# Patient Record
Sex: Male | Born: 1955 | Race: White | Hispanic: No | Marital: Married | Smoking: Former smoker
Health system: Southern US, Community
[De-identification: ages and names within clinical notes are randomized; demographics above are authoritative.]

## PROBLEM LIST (undated history)

## (undated) ENCOUNTER — Emergency Department (HOSPITAL_COMMUNITY): Payer: Self-pay | Source: Home / Self Care

## (undated) DIAGNOSIS — E119 Type 2 diabetes mellitus without complications: Secondary | ICD-10-CM

## (undated) DIAGNOSIS — G709 Myoneural disorder, unspecified: Secondary | ICD-10-CM

## (undated) DIAGNOSIS — M1990 Unspecified osteoarthritis, unspecified site: Secondary | ICD-10-CM

## (undated) HISTORY — PX: OTHER SURGICAL HISTORY: SHX169

## (undated) HISTORY — PX: TONSILLECTOMY: SUR1361

## (undated) HISTORY — DX: Type 2 diabetes mellitus without complications: E11.9

## (undated) HISTORY — DX: Myoneural disorder, unspecified: G70.9

## (undated) HISTORY — DX: Unspecified osteoarthritis, unspecified site: M19.90

---

## 2017-01-04 DIAGNOSIS — E1140 Type 2 diabetes mellitus with diabetic neuropathy, unspecified: Secondary | ICD-10-CM | POA: Diagnosis not present

## 2017-01-04 DIAGNOSIS — M79601 Pain in right arm: Secondary | ICD-10-CM | POA: Diagnosis not present

## 2017-01-04 DIAGNOSIS — M5010 Cervical disc disorder with radiculopathy, unspecified cervical region: Secondary | ICD-10-CM | POA: Diagnosis not present

## 2017-01-04 DIAGNOSIS — M545 Low back pain: Secondary | ICD-10-CM | POA: Diagnosis not present

## 2017-01-04 DIAGNOSIS — Z79891 Long term (current) use of opiate analgesic: Secondary | ICD-10-CM | POA: Diagnosis not present

## 2017-01-04 DIAGNOSIS — E785 Hyperlipidemia, unspecified: Secondary | ICD-10-CM | POA: Diagnosis not present

## 2017-01-04 DIAGNOSIS — I10 Essential (primary) hypertension: Secondary | ICD-10-CM | POA: Diagnosis not present

## 2017-01-04 DIAGNOSIS — M792 Neuralgia and neuritis, unspecified: Secondary | ICD-10-CM | POA: Diagnosis not present

## 2017-01-04 DIAGNOSIS — G5600 Carpal tunnel syndrome, unspecified upper limb: Secondary | ICD-10-CM | POA: Diagnosis not present

## 2017-01-19 DIAGNOSIS — M545 Low back pain: Secondary | ICD-10-CM | POA: Diagnosis not present

## 2017-01-19 DIAGNOSIS — I10 Essential (primary) hypertension: Secondary | ICD-10-CM | POA: Diagnosis not present

## 2017-01-19 DIAGNOSIS — E118 Type 2 diabetes mellitus with unspecified complications: Secondary | ICD-10-CM | POA: Diagnosis not present

## 2017-01-19 DIAGNOSIS — E7800 Pure hypercholesterolemia, unspecified: Secondary | ICD-10-CM | POA: Diagnosis not present

## 2017-01-22 ENCOUNTER — Other Ambulatory Visit: Payer: Self-pay | Admitting: Neurology

## 2017-01-22 DIAGNOSIS — N882 Stricture and stenosis of cervix uteri: Secondary | ICD-10-CM

## 2017-01-22 DIAGNOSIS — M5410 Radiculopathy, site unspecified: Secondary | ICD-10-CM

## 2017-01-25 ENCOUNTER — Other Ambulatory Visit: Payer: Self-pay | Admitting: Neurology

## 2017-01-25 DIAGNOSIS — M79642 Pain in left hand: Secondary | ICD-10-CM

## 2017-02-03 DIAGNOSIS — M545 Low back pain: Secondary | ICD-10-CM | POA: Diagnosis not present

## 2017-02-03 DIAGNOSIS — G5600 Carpal tunnel syndrome, unspecified upper limb: Secondary | ICD-10-CM | POA: Diagnosis not present

## 2017-02-03 DIAGNOSIS — I10 Essential (primary) hypertension: Secondary | ICD-10-CM | POA: Diagnosis not present

## 2017-02-03 DIAGNOSIS — E1140 Type 2 diabetes mellitus with diabetic neuropathy, unspecified: Secondary | ICD-10-CM | POA: Diagnosis not present

## 2017-02-03 DIAGNOSIS — M79601 Pain in right arm: Secondary | ICD-10-CM | POA: Diagnosis not present

## 2017-02-03 DIAGNOSIS — E785 Hyperlipidemia, unspecified: Secondary | ICD-10-CM | POA: Diagnosis not present

## 2017-02-06 ENCOUNTER — Ambulatory Visit
Admission: RE | Admit: 2017-02-06 | Discharge: 2017-02-06 | Disposition: A | Discharge status: Home / Self Care | Payer: Self-pay | Source: Ambulatory Visit | Attending: Neurology | Admitting: Neurology

## 2017-02-06 ENCOUNTER — Ambulatory Visit (HOSPITAL_COMMUNITY)
Admission: RE | Admit: 2017-02-06 | Discharge: 2017-02-06 | Disposition: A | Discharge status: Home / Self Care | Payer: Medicare HMO | Source: Ambulatory Visit | Attending: Neurology | Admitting: Neurology

## 2017-02-06 DIAGNOSIS — M79642 Pain in left hand: Secondary | ICD-10-CM | POA: Insufficient documentation

## 2017-02-06 DIAGNOSIS — M50221 Other cervical disc displacement at C4-C5 level: Secondary | ICD-10-CM | POA: Diagnosis not present

## 2017-02-06 DIAGNOSIS — N882 Stricture and stenosis of cervix uteri: Secondary | ICD-10-CM

## 2017-02-06 DIAGNOSIS — M50222 Other cervical disc displacement at C5-C6 level: Secondary | ICD-10-CM | POA: Diagnosis not present

## 2017-02-06 DIAGNOSIS — M50223 Other cervical disc displacement at C6-C7 level: Secondary | ICD-10-CM | POA: Diagnosis not present

## 2017-02-06 DIAGNOSIS — M5410 Radiculopathy, site unspecified: Secondary | ICD-10-CM

## 2017-02-06 DIAGNOSIS — M5021 Other cervical disc displacement,  high cervical region: Secondary | ICD-10-CM | POA: Diagnosis not present

## 2017-03-05 DIAGNOSIS — M79601 Pain in right arm: Secondary | ICD-10-CM | POA: Diagnosis not present

## 2017-03-05 DIAGNOSIS — M545 Low back pain: Secondary | ICD-10-CM | POA: Diagnosis not present

## 2017-03-05 DIAGNOSIS — I10 Essential (primary) hypertension: Secondary | ICD-10-CM | POA: Diagnosis not present

## 2017-03-05 DIAGNOSIS — E1140 Type 2 diabetes mellitus with diabetic neuropathy, unspecified: Secondary | ICD-10-CM | POA: Diagnosis not present

## 2017-03-05 DIAGNOSIS — M542 Cervicalgia: Secondary | ICD-10-CM | POA: Diagnosis not present

## 2017-03-05 DIAGNOSIS — E785 Hyperlipidemia, unspecified: Secondary | ICD-10-CM | POA: Diagnosis not present

## 2017-03-05 DIAGNOSIS — G5600 Carpal tunnel syndrome, unspecified upper limb: Secondary | ICD-10-CM | POA: Diagnosis not present

## 2017-03-11 DIAGNOSIS — M542 Cervicalgia: Secondary | ICD-10-CM | POA: Diagnosis not present

## 2017-03-11 DIAGNOSIS — M79601 Pain in right arm: Secondary | ICD-10-CM | POA: Diagnosis not present

## 2017-03-11 DIAGNOSIS — I10 Essential (primary) hypertension: Secondary | ICD-10-CM | POA: Diagnosis not present

## 2017-03-11 DIAGNOSIS — M545 Low back pain: Secondary | ICD-10-CM | POA: Diagnosis not present

## 2017-03-11 DIAGNOSIS — E785 Hyperlipidemia, unspecified: Secondary | ICD-10-CM | POA: Diagnosis not present

## 2017-03-11 DIAGNOSIS — E1140 Type 2 diabetes mellitus with diabetic neuropathy, unspecified: Secondary | ICD-10-CM | POA: Diagnosis not present

## 2017-03-11 DIAGNOSIS — G5600 Carpal tunnel syndrome, unspecified upper limb: Secondary | ICD-10-CM | POA: Diagnosis not present

## 2017-03-17 DIAGNOSIS — L729 Follicular cyst of the skin and subcutaneous tissue, unspecified: Secondary | ICD-10-CM | POA: Diagnosis not present

## 2017-03-17 DIAGNOSIS — M542 Cervicalgia: Secondary | ICD-10-CM | POA: Diagnosis not present

## 2017-03-17 DIAGNOSIS — I10 Essential (primary) hypertension: Secondary | ICD-10-CM | POA: Diagnosis not present

## 2017-03-17 DIAGNOSIS — E1140 Type 2 diabetes mellitus with diabetic neuropathy, unspecified: Secondary | ICD-10-CM | POA: Diagnosis not present

## 2017-03-17 DIAGNOSIS — M79601 Pain in right arm: Secondary | ICD-10-CM | POA: Diagnosis not present

## 2017-03-17 DIAGNOSIS — M545 Low back pain: Secondary | ICD-10-CM | POA: Diagnosis not present

## 2017-03-17 DIAGNOSIS — Z79891 Long term (current) use of opiate analgesic: Secondary | ICD-10-CM | POA: Diagnosis not present

## 2017-03-17 DIAGNOSIS — E785 Hyperlipidemia, unspecified: Secondary | ICD-10-CM | POA: Diagnosis not present

## 2017-03-17 DIAGNOSIS — G5600 Carpal tunnel syndrome, unspecified upper limb: Secondary | ICD-10-CM | POA: Diagnosis not present

## 2017-03-26 DIAGNOSIS — E118 Type 2 diabetes mellitus with unspecified complications: Secondary | ICD-10-CM | POA: Diagnosis not present

## 2017-03-26 DIAGNOSIS — I10 Essential (primary) hypertension: Secondary | ICD-10-CM | POA: Diagnosis not present

## 2017-03-26 DIAGNOSIS — Z125 Encounter for screening for malignant neoplasm of prostate: Secondary | ICD-10-CM | POA: Diagnosis not present

## 2017-03-30 DIAGNOSIS — M549 Dorsalgia, unspecified: Secondary | ICD-10-CM | POA: Diagnosis not present

## 2017-03-30 DIAGNOSIS — E118 Type 2 diabetes mellitus with unspecified complications: Secondary | ICD-10-CM | POA: Diagnosis not present

## 2017-03-30 DIAGNOSIS — J309 Allergic rhinitis, unspecified: Secondary | ICD-10-CM | POA: Diagnosis not present

## 2017-03-30 DIAGNOSIS — I10 Essential (primary) hypertension: Secondary | ICD-10-CM | POA: Diagnosis not present

## 2017-03-30 DIAGNOSIS — Z23 Encounter for immunization: Secondary | ICD-10-CM | POA: Diagnosis not present

## 2017-03-30 DIAGNOSIS — E7800 Pure hypercholesterolemia, unspecified: Secondary | ICD-10-CM | POA: Diagnosis not present

## 2017-04-15 DIAGNOSIS — M79601 Pain in right arm: Secondary | ICD-10-CM | POA: Diagnosis not present

## 2017-04-15 DIAGNOSIS — G5600 Carpal tunnel syndrome, unspecified upper limb: Secondary | ICD-10-CM | POA: Diagnosis not present

## 2017-04-15 DIAGNOSIS — M542 Cervicalgia: Secondary | ICD-10-CM | POA: Diagnosis not present

## 2017-04-15 DIAGNOSIS — M545 Low back pain: Secondary | ICD-10-CM | POA: Diagnosis not present

## 2017-04-20 DIAGNOSIS — M542 Cervicalgia: Secondary | ICD-10-CM | POA: Diagnosis not present

## 2017-04-20 DIAGNOSIS — M4722 Other spondylosis with radiculopathy, cervical region: Secondary | ICD-10-CM | POA: Diagnosis not present

## 2017-04-20 DIAGNOSIS — M545 Low back pain: Secondary | ICD-10-CM | POA: Diagnosis not present

## 2017-04-20 DIAGNOSIS — Z6829 Body mass index (BMI) 29.0-29.9, adult: Secondary | ICD-10-CM | POA: Diagnosis not present

## 2017-04-20 DIAGNOSIS — M4802 Spinal stenosis, cervical region: Secondary | ICD-10-CM | POA: Diagnosis not present

## 2017-04-20 DIAGNOSIS — I10 Essential (primary) hypertension: Secondary | ICD-10-CM | POA: Diagnosis not present

## 2017-04-27 ENCOUNTER — Other Ambulatory Visit: Payer: Self-pay | Admitting: Neurosurgery

## 2017-04-27 DIAGNOSIS — G8929 Other chronic pain: Secondary | ICD-10-CM

## 2017-04-27 DIAGNOSIS — M545 Low back pain: Principal | ICD-10-CM

## 2017-05-05 DIAGNOSIS — M542 Cervicalgia: Secondary | ICD-10-CM | POA: Diagnosis not present

## 2017-05-05 DIAGNOSIS — G5600 Carpal tunnel syndrome, unspecified upper limb: Secondary | ICD-10-CM | POA: Diagnosis not present

## 2017-05-05 DIAGNOSIS — M545 Low back pain: Secondary | ICD-10-CM | POA: Diagnosis not present

## 2017-05-05 DIAGNOSIS — E1140 Type 2 diabetes mellitus with diabetic neuropathy, unspecified: Secondary | ICD-10-CM | POA: Diagnosis not present

## 2017-05-08 ENCOUNTER — Ambulatory Visit
Admission: RE | Admit: 2017-05-08 | Discharge: 2017-05-08 | Disposition: A | Discharge status: Home / Self Care | Payer: Medicare HMO | Source: Ambulatory Visit | Attending: Neurosurgery | Admitting: Neurosurgery

## 2017-05-08 DIAGNOSIS — M5127 Other intervertebral disc displacement, lumbosacral region: Secondary | ICD-10-CM | POA: Diagnosis not present

## 2017-05-08 DIAGNOSIS — G8929 Other chronic pain: Secondary | ICD-10-CM

## 2017-05-08 DIAGNOSIS — M545 Low back pain: Principal | ICD-10-CM

## 2017-06-02 DIAGNOSIS — G8921 Chronic pain due to trauma: Secondary | ICD-10-CM | POA: Diagnosis not present

## 2017-06-02 DIAGNOSIS — M545 Low back pain: Secondary | ICD-10-CM | POA: Diagnosis not present

## 2017-06-02 DIAGNOSIS — M461 Sacroiliitis, not elsewhere classified: Secondary | ICD-10-CM | POA: Diagnosis not present

## 2017-06-02 DIAGNOSIS — Z79891 Long term (current) use of opiate analgesic: Secondary | ICD-10-CM | POA: Diagnosis not present

## 2017-06-17 DIAGNOSIS — E118 Type 2 diabetes mellitus with unspecified complications: Secondary | ICD-10-CM | POA: Diagnosis not present

## 2017-06-17 DIAGNOSIS — I10 Essential (primary) hypertension: Secondary | ICD-10-CM | POA: Diagnosis not present

## 2017-06-17 DIAGNOSIS — E7800 Pure hypercholesterolemia, unspecified: Secondary | ICD-10-CM | POA: Diagnosis not present

## 2017-06-22 DIAGNOSIS — Z79899 Other long term (current) drug therapy: Secondary | ICD-10-CM | POA: Diagnosis not present

## 2017-06-22 DIAGNOSIS — M542 Cervicalgia: Secondary | ICD-10-CM | POA: Diagnosis not present

## 2017-06-22 DIAGNOSIS — E7800 Pure hypercholesterolemia, unspecified: Secondary | ICD-10-CM | POA: Diagnosis not present

## 2017-06-22 DIAGNOSIS — E118 Type 2 diabetes mellitus with unspecified complications: Secondary | ICD-10-CM | POA: Diagnosis not present

## 2017-06-22 DIAGNOSIS — M545 Low back pain: Secondary | ICD-10-CM | POA: Diagnosis not present

## 2017-06-22 DIAGNOSIS — I10 Essential (primary) hypertension: Secondary | ICD-10-CM | POA: Diagnosis not present

## 2017-06-22 DIAGNOSIS — Z7984 Long term (current) use of oral hypoglycemic drugs: Secondary | ICD-10-CM | POA: Diagnosis not present

## 2017-07-01 DIAGNOSIS — M545 Low back pain: Secondary | ICD-10-CM | POA: Diagnosis not present

## 2017-07-01 DIAGNOSIS — M5412 Radiculopathy, cervical region: Secondary | ICD-10-CM | POA: Diagnosis not present

## 2017-07-01 DIAGNOSIS — G8921 Chronic pain due to trauma: Secondary | ICD-10-CM | POA: Diagnosis not present

## 2017-07-01 DIAGNOSIS — M461 Sacroiliitis, not elsewhere classified: Secondary | ICD-10-CM | POA: Diagnosis not present

## 2017-07-27 DIAGNOSIS — M5412 Radiculopathy, cervical region: Secondary | ICD-10-CM | POA: Diagnosis not present

## 2017-07-27 DIAGNOSIS — M461 Sacroiliitis, not elsewhere classified: Secondary | ICD-10-CM | POA: Diagnosis not present

## 2017-07-27 DIAGNOSIS — G8921 Chronic pain due to trauma: Secondary | ICD-10-CM | POA: Diagnosis not present

## 2017-07-27 DIAGNOSIS — G4733 Obstructive sleep apnea (adult) (pediatric): Secondary | ICD-10-CM | POA: Diagnosis not present

## 2017-07-30 ENCOUNTER — Encounter (HOSPITAL_BASED_OUTPATIENT_CLINIC_OR_DEPARTMENT_OTHER): Payer: Self-pay

## 2017-07-30 DIAGNOSIS — I10 Essential (primary) hypertension: Secondary | ICD-10-CM | POA: Diagnosis not present

## 2017-07-30 DIAGNOSIS — G4733 Obstructive sleep apnea (adult) (pediatric): Secondary | ICD-10-CM

## 2017-07-30 DIAGNOSIS — Z6829 Body mass index (BMI) 29.0-29.9, adult: Secondary | ICD-10-CM | POA: Diagnosis not present

## 2017-07-30 DIAGNOSIS — M545 Low back pain: Secondary | ICD-10-CM | POA: Diagnosis not present

## 2017-08-06 ENCOUNTER — Ambulatory Visit: Payer: Medicare HMO | Attending: Neurology | Admitting: Neurology

## 2017-08-06 DIAGNOSIS — G4733 Obstructive sleep apnea (adult) (pediatric): Secondary | ICD-10-CM | POA: Diagnosis present

## 2017-08-15 NOTE — Procedures (Signed)
   [facility] Jenyfer Trawick A. Gerilyn Pilgrimoonquah, MD     [email or web address]             HOME SLEEP STUDY  LOCATION: [facility]   Patient Name: Erik Taylor, Erik Taylor Study Date: 08/06/2017 Gender: Male D.O.B: Nov 13, 1955 Age (years): [age] Referring Provider: Beryle BeamsKofi Nichole Keltner MD, ABSM Height (inches): 70 Interpreting Physician: Beryle BeamsKofi Aveon Colquhoun MD, ABSM Weight (lbs): 206 RPSGT: Peak, Robert BMI: 30 MRN: [id number] Neck Size: CLINICAL INFORMATION Sleep Study Type: HST     Indication for sleep study: OSA     Epworth Sleepiness Score: NA  SLEEP STUDY TECHNIQUE A multi-channel overnight portable sleep study was performed. The channels recorded were: nasal airflow, thoracic respiratory movement, and oxygen saturation with a pulse oximetry. Snoring was also monitored.  MEDICATIONS Patient self administered medications include: N/A. No current outpatient medications on file.   SLEEP ARCHITECTURE Patient was studied for 465.5 minutes. The sleep efficiency was 77.6 % and the patient was supine for 37%. The arousal index was 0.0 per hour.  RESPIRATORY PARAMETERS The overall AHI was 18.4 per hour, with a central apnea index of 1.2 per hour.  The oxygen nadir was 84% during sleep.     CARDIAC DATA Mean heart rate during sleep was 75.4 bpm.  IMPRESSIONS Moderate obstructive sleep apnea occurred during this study (AHI = 18.4/h). A formal CPAP study is recommended.   Argie RammingKofi A Shazia Mitchener, MD Diplomate, American Board of Sleep Medicine.  ELECTRONICALLY SIGNED ON:  08/15/2017, 10:02 PM  [facility] PH: [phone]   FX: [phone] ACCREDITED BY THE AMERICAN ACADEMY OF SLEEP MEDICINE

## 2017-08-25 DIAGNOSIS — M5412 Radiculopathy, cervical region: Secondary | ICD-10-CM | POA: Diagnosis not present

## 2017-08-25 DIAGNOSIS — G4733 Obstructive sleep apnea (adult) (pediatric): Secondary | ICD-10-CM | POA: Diagnosis not present

## 2017-08-25 DIAGNOSIS — M461 Sacroiliitis, not elsewhere classified: Secondary | ICD-10-CM | POA: Diagnosis not present

## 2017-08-25 DIAGNOSIS — G8921 Chronic pain due to trauma: Secondary | ICD-10-CM | POA: Diagnosis not present

## 2017-09-08 DIAGNOSIS — E118 Type 2 diabetes mellitus with unspecified complications: Secondary | ICD-10-CM | POA: Diagnosis not present

## 2017-09-08 DIAGNOSIS — I10 Essential (primary) hypertension: Secondary | ICD-10-CM | POA: Diagnosis not present

## 2017-09-14 DIAGNOSIS — Z6831 Body mass index (BMI) 31.0-31.9, adult: Secondary | ICD-10-CM | POA: Diagnosis not present

## 2017-09-14 DIAGNOSIS — Z7984 Long term (current) use of oral hypoglycemic drugs: Secondary | ICD-10-CM | POA: Diagnosis not present

## 2017-09-14 DIAGNOSIS — M545 Low back pain: Secondary | ICD-10-CM | POA: Diagnosis not present

## 2017-09-14 DIAGNOSIS — Z79899 Other long term (current) drug therapy: Secondary | ICD-10-CM | POA: Diagnosis not present

## 2017-09-14 DIAGNOSIS — E118 Type 2 diabetes mellitus with unspecified complications: Secondary | ICD-10-CM | POA: Diagnosis not present

## 2017-09-14 DIAGNOSIS — E7800 Pure hypercholesterolemia, unspecified: Secondary | ICD-10-CM | POA: Diagnosis not present

## 2017-09-14 DIAGNOSIS — I10 Essential (primary) hypertension: Secondary | ICD-10-CM | POA: Diagnosis not present

## 2017-09-14 DIAGNOSIS — E6609 Other obesity due to excess calories: Secondary | ICD-10-CM | POA: Diagnosis not present

## 2017-09-14 DIAGNOSIS — M79601 Pain in right arm: Secondary | ICD-10-CM | POA: Diagnosis not present

## 2017-09-21 DIAGNOSIS — M5412 Radiculopathy, cervical region: Secondary | ICD-10-CM | POA: Diagnosis not present

## 2017-09-21 DIAGNOSIS — G8921 Chronic pain due to trauma: Secondary | ICD-10-CM | POA: Diagnosis not present

## 2017-09-21 DIAGNOSIS — M545 Low back pain: Secondary | ICD-10-CM | POA: Diagnosis not present

## 2017-09-21 DIAGNOSIS — M461 Sacroiliitis, not elsewhere classified: Secondary | ICD-10-CM | POA: Diagnosis not present

## 2017-10-06 ENCOUNTER — Ambulatory Visit: Payer: Medicare HMO | Attending: Neurology | Admitting: Neurology

## 2017-10-06 DIAGNOSIS — G4733 Obstructive sleep apnea (adult) (pediatric): Secondary | ICD-10-CM | POA: Diagnosis not present

## 2017-10-09 NOTE — Procedures (Signed)
  [facility] Jahron Hunsinger A. Gerilyn Pilgrimoonquah, MD     [email or web address]             NOCTURNAL POLYSOMNOGRAPHY   LOCATION: [facility]  Patient Name: Erik Taylor, [name] Study Date: 10/06/2017 Gender: Male D.O.B: February 05, 1956 Age (years): [age] Referring Provider: Beryle BeamsKofi Nikiya Starn MD, ABSM Height (inches): 70 Interpreting Physician: Beryle BeamsKofi Yael Coppess MD, ABSM Weight (lbs): 206 RPSGT: Peak, Robert BMI: 30 MRN: [id number] Neck Size: 17.00 <br> <br> CLINICAL INFORMATION The patient is referred for a CPAP titration to treat sleep apnea.    Date of NPSG, Split Night or HST:  SLEEP STUDY TECHNIQUE As per the AASM Manual for the Scoring of Sleep and Associated Events v2.3 (April 2016) with a hypopnea requiring 4% desaturations.  The channels recorded and monitored were frontal, central and occipital EEG, electrooculogram (EOG), submentalis EMG (chin), nasal and oral airflow, thoracic and abdominal wall motion, anterior tibialis EMG, snore microphone, electrocardiogram, and pulse oximetry. Continuous positive airway pressure (CPAP) was initiated at the beginning of the study and titrated to treat sleep-disordered breathing.  MEDICATIONS Medications self-administered by patient taken the night of the study : N/A  TECHNICIAN COMMENTS Comments added by technician: PATIENT COMES TO SLEEP CENTER FOR CPAP TITRATION. PATIENT WAS ADHERENT TO CPAP THERAPY. NASAL PILLOW MASK WAS APPLIED WITH OPTIMAL FITTING ACHIEVED. NO ORAL BREATHING NOTED. STAGES I, II AND REM WERE OBSERVED. SUPINE AND LATERAL POSITIONS OBSERVED. MOST SLEEP TIME WAS IN LATERAL POSITION. AIRWAY OBSTRUCTIONS WERE ELIMINATED WITH CPAP PRESSURE. NO LEG MOVEMENTS NOTED. ECG SHOWED NORMAL SINUS RHYTHM. Comments added by scorer: N/A RESPIRATORY PARAMETERS Optimal PAP Pressure (cm): 8 AHI at Optimal Pressure (/hr): 0.0 Overall Minimal O2 (%): 85.0 Supine % at Optimal Pressure (%): 0 Minimal O2 at Optimal Pressure (%): 92.0   SLEEP  ARCHITECTURE The study was initiated at 9:22:42 PM and ended at 4:30:02 AM.  Sleep onset time was 45.5 minutes and the sleep efficiency was 64.0%%. The total sleep time was 273.5 minutes.  The patient spent 7.7%% of the night in stage N1 sleep, 71.7%% in stage N2 sleep, 0.0%% in stage N3 and 20.66% in REM.Stage REM latency was 135.0 minutes  Wake after sleep onset was 108.4. Alpha intrusion was absent. Supine sleep was 0.55%.  CARDIAC DATA The 2 lead EKG demonstrated sinus rhythm. The mean heart rate was 73.7 beats per minute. Other EKG findings include: None. LEG MOVEMENT DATA The total Periodic Limb Movements of Sleep (PLMS) were 0. The PLMS index was 0.0. A PLMS index of <15 is considered normal in adults.  IMPRESSIONS - The optimal CPAP is 8 cm of water. -  Absent slow wave sleep is observed.   Argie RammingKofi A Aniela Caniglia, MD Diplomate, American Board of Sleep Medicine. ELECTRONICALLY SIGNED ON:  10/09/2017, 12:59 PM [place] PH: [phone]   FX: [phone] ACCREDITED BY THE AMERICAN ACADEMY OF SLEEP MEDICINE

## 2017-10-20 DIAGNOSIS — M5412 Radiculopathy, cervical region: Secondary | ICD-10-CM | POA: Diagnosis not present

## 2017-10-20 DIAGNOSIS — M461 Sacroiliitis, not elsewhere classified: Secondary | ICD-10-CM | POA: Diagnosis not present

## 2017-10-20 DIAGNOSIS — Z79891 Long term (current) use of opiate analgesic: Secondary | ICD-10-CM | POA: Diagnosis not present

## 2017-10-20 DIAGNOSIS — G8921 Chronic pain due to trauma: Secondary | ICD-10-CM | POA: Diagnosis not present

## 2017-11-12 DIAGNOSIS — M4722 Other spondylosis with radiculopathy, cervical region: Secondary | ICD-10-CM | POA: Diagnosis not present

## 2017-11-12 DIAGNOSIS — Z6829 Body mass index (BMI) 29.0-29.9, adult: Secondary | ICD-10-CM | POA: Diagnosis not present

## 2017-11-12 DIAGNOSIS — M545 Low back pain: Secondary | ICD-10-CM | POA: Diagnosis not present

## 2017-11-12 DIAGNOSIS — I10 Essential (primary) hypertension: Secondary | ICD-10-CM | POA: Diagnosis not present

## 2017-11-18 DIAGNOSIS — M5412 Radiculopathy, cervical region: Secondary | ICD-10-CM | POA: Diagnosis not present

## 2017-11-18 DIAGNOSIS — G4733 Obstructive sleep apnea (adult) (pediatric): Secondary | ICD-10-CM | POA: Diagnosis not present

## 2017-11-18 DIAGNOSIS — M461 Sacroiliitis, not elsewhere classified: Secondary | ICD-10-CM | POA: Diagnosis not present

## 2017-11-18 DIAGNOSIS — G8921 Chronic pain due to trauma: Secondary | ICD-10-CM | POA: Diagnosis not present

## 2017-11-23 ENCOUNTER — Encounter: Payer: Self-pay | Admitting: *Deleted

## 2017-11-23 NOTE — Telephone Encounter (Signed)
This encounter was created in error - please disregard.

## 2017-12-02 DIAGNOSIS — G4733 Obstructive sleep apnea (adult) (pediatric): Secondary | ICD-10-CM | POA: Diagnosis not present

## 2017-12-04 DIAGNOSIS — E118 Type 2 diabetes mellitus with unspecified complications: Secondary | ICD-10-CM | POA: Diagnosis not present

## 2017-12-04 DIAGNOSIS — Z125 Encounter for screening for malignant neoplasm of prostate: Secondary | ICD-10-CM | POA: Diagnosis not present

## 2017-12-04 DIAGNOSIS — Z13820 Encounter for screening for osteoporosis: Secondary | ICD-10-CM | POA: Diagnosis not present

## 2017-12-04 DIAGNOSIS — E7800 Pure hypercholesterolemia, unspecified: Secondary | ICD-10-CM | POA: Diagnosis not present

## 2017-12-04 DIAGNOSIS — Z79899 Other long term (current) drug therapy: Secondary | ICD-10-CM | POA: Diagnosis not present

## 2017-12-04 DIAGNOSIS — Z1389 Encounter for screening for other disorder: Secondary | ICD-10-CM | POA: Diagnosis not present

## 2017-12-04 DIAGNOSIS — I10 Essential (primary) hypertension: Secondary | ICD-10-CM | POA: Diagnosis not present

## 2017-12-06 DIAGNOSIS — I10 Essential (primary) hypertension: Secondary | ICD-10-CM | POA: Diagnosis not present

## 2017-12-06 DIAGNOSIS — E538 Deficiency of other specified B group vitamins: Secondary | ICD-10-CM | POA: Diagnosis not present

## 2017-12-06 DIAGNOSIS — G4733 Obstructive sleep apnea (adult) (pediatric): Secondary | ICD-10-CM | POA: Diagnosis not present

## 2017-12-06 DIAGNOSIS — M545 Low back pain: Secondary | ICD-10-CM | POA: Diagnosis not present

## 2017-12-06 DIAGNOSIS — Z0001 Encounter for general adult medical examination with abnormal findings: Secondary | ICD-10-CM | POA: Diagnosis not present

## 2017-12-06 DIAGNOSIS — M542 Cervicalgia: Secondary | ICD-10-CM | POA: Diagnosis not present

## 2017-12-06 DIAGNOSIS — E118 Type 2 diabetes mellitus with unspecified complications: Secondary | ICD-10-CM | POA: Diagnosis not present

## 2017-12-06 DIAGNOSIS — M79601 Pain in right arm: Secondary | ICD-10-CM | POA: Diagnosis not present

## 2017-12-06 DIAGNOSIS — E7800 Pure hypercholesterolemia, unspecified: Secondary | ICD-10-CM | POA: Diagnosis not present

## 2017-12-06 DIAGNOSIS — K219 Gastro-esophageal reflux disease without esophagitis: Secondary | ICD-10-CM | POA: Diagnosis not present

## 2017-12-14 DIAGNOSIS — Z79891 Long term (current) use of opiate analgesic: Secondary | ICD-10-CM | POA: Diagnosis not present

## 2017-12-14 DIAGNOSIS — M5412 Radiculopathy, cervical region: Secondary | ICD-10-CM | POA: Diagnosis not present

## 2017-12-14 DIAGNOSIS — M461 Sacroiliitis, not elsewhere classified: Secondary | ICD-10-CM | POA: Diagnosis not present

## 2017-12-14 DIAGNOSIS — G8921 Chronic pain due to trauma: Secondary | ICD-10-CM | POA: Diagnosis not present

## 2017-12-20 DIAGNOSIS — E538 Deficiency of other specified B group vitamins: Secondary | ICD-10-CM | POA: Diagnosis not present

## 2018-01-02 DIAGNOSIS — G4733 Obstructive sleep apnea (adult) (pediatric): Secondary | ICD-10-CM | POA: Diagnosis not present

## 2018-01-04 DIAGNOSIS — E538 Deficiency of other specified B group vitamins: Secondary | ICD-10-CM | POA: Diagnosis not present

## 2018-01-17 DIAGNOSIS — E538 Deficiency of other specified B group vitamins: Secondary | ICD-10-CM | POA: Diagnosis not present

## 2018-01-31 DIAGNOSIS — E538 Deficiency of other specified B group vitamins: Secondary | ICD-10-CM | POA: Diagnosis not present

## 2018-02-01 ENCOUNTER — Other Ambulatory Visit: Payer: Self-pay

## 2018-02-01 ENCOUNTER — Encounter: Payer: Medicare HMO | Attending: Physical Medicine & Rehabilitation | Admitting: Physical Medicine & Rehabilitation

## 2018-02-01 ENCOUNTER — Encounter: Payer: Self-pay | Admitting: Physical Medicine & Rehabilitation

## 2018-02-01 ENCOUNTER — Other Ambulatory Visit: Payer: Self-pay | Admitting: Physical Medicine & Rehabilitation

## 2018-02-01 VITALS — BP 147/94 | HR 80 | Ht 68.0 in | Wt 210.0 lb

## 2018-02-01 DIAGNOSIS — Z5181 Encounter for therapeutic drug level monitoring: Secondary | ICD-10-CM

## 2018-02-01 DIAGNOSIS — M4722 Other spondylosis with radiculopathy, cervical region: Secondary | ICD-10-CM

## 2018-02-01 DIAGNOSIS — M5136 Other intervertebral disc degeneration, lumbar region: Secondary | ICD-10-CM | POA: Insufficient documentation

## 2018-02-01 DIAGNOSIS — M4726 Other spondylosis with radiculopathy, lumbar region: Secondary | ICD-10-CM

## 2018-02-01 DIAGNOSIS — M1711 Unilateral primary osteoarthritis, right knee: Secondary | ICD-10-CM | POA: Insufficient documentation

## 2018-02-01 DIAGNOSIS — Z79891 Long term (current) use of opiate analgesic: Secondary | ICD-10-CM | POA: Diagnosis not present

## 2018-02-01 DIAGNOSIS — G894 Chronic pain syndrome: Secondary | ICD-10-CM

## 2018-02-01 DIAGNOSIS — Z87891 Personal history of nicotine dependence: Secondary | ICD-10-CM | POA: Diagnosis not present

## 2018-02-01 DIAGNOSIS — S4491XS Injury of unspecified nerve at shoulder and upper arm level, right arm, sequela: Secondary | ICD-10-CM | POA: Diagnosis not present

## 2018-02-01 DIAGNOSIS — M47812 Spondylosis without myelopathy or radiculopathy, cervical region: Secondary | ICD-10-CM | POA: Diagnosis not present

## 2018-02-01 DIAGNOSIS — G4733 Obstructive sleep apnea (adult) (pediatric): Secondary | ICD-10-CM | POA: Diagnosis not present

## 2018-02-01 MED ORDER — CARBAMAZEPINE 200 MG PO TABS: 200.0000 mg | ORAL_TABLET | Freq: Two times a day (BID) | ORAL | 5 refills | Status: DC

## 2018-02-01 MED ORDER — METHOCARBAMOL 500 MG PO TABS: 500.0000 mg | ORAL_TABLET | Freq: Four times a day (QID) | ORAL | 3 refills | Status: DC | PRN

## 2018-02-01 NOTE — Patient Instructions (Signed)
PLEASE FEEL FREE TO CALL OUR OFFICE WITH ANY PROBLEMS OR QUESTIONS ([phone])  ONCE I HAVE CONFIRMATION THAT YOUR URINE SPECIMEN IS CONSISTENT WITH YOUR HISTORY AND PRESCRIBED MEDICATIONS, I WILL BE WILLING TO PRESCRIBE YOUR PAIN MEDICATION. THE RESULTS OF YOUR URINE TESTING COULD TAKE A WEEK OR MORE TO RETURN, HOWEVER.  IF WE DO NOT CONTACT YOU REGARDING THESE RESULTS WITHIN 10 DAYS, PLEASE CONTACT US.           
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
## 2018-02-07 LAB — TOXASSURE SELECT,+ANTIDEPR,UR

## 2018-02-10 ENCOUNTER — Telehealth: Payer: Self-pay | Admitting: *Deleted

## 2018-02-10 NOTE — Telephone Encounter (Signed)
Mr Erik Taylor called asking about getting his pain medication prescribed.

## 2018-02-10 NOTE — Telephone Encounter (Signed)
Urine drug screen has no medications present. He reported he took a percocet the day before the test so you would expect to see some metabolite and would be technically inconsistent.  But, he only gets #55 tablets/30 days. Rx filled 01/14/18 and he had #10 at appointment. This would indicate he only takes occasionally.

## 2018-02-11 MED ORDER — OXYCODONE-ACETAMINOPHEN 10-325 MG PO TABS: 1.0000 | ORAL_TABLET | ORAL | 0 refills | Status: DC | PRN

## 2018-02-11 NOTE — Telephone Encounter (Signed)
Done

## 2018-02-11 NOTE — Telephone Encounter (Signed)
Mr Erik Taylor notified.

## 2018-02-21 DIAGNOSIS — E538 Deficiency of other specified B group vitamins: Secondary | ICD-10-CM | POA: Diagnosis not present

## 2018-03-04 ENCOUNTER — Encounter: Payer: Medicare HMO | Attending: Physical Medicine & Rehabilitation | Admitting: Registered Nurse

## 2018-03-04 ENCOUNTER — Encounter: Payer: Self-pay | Admitting: Registered Nurse

## 2018-03-04 VITALS — BP 146/89 | HR 81 | Ht 70.0 in | Wt 215.0 lb

## 2018-03-04 DIAGNOSIS — M47812 Spondylosis without myelopathy or radiculopathy, cervical region: Secondary | ICD-10-CM | POA: Insufficient documentation

## 2018-03-04 DIAGNOSIS — Z87891 Personal history of nicotine dependence: Secondary | ICD-10-CM | POA: Insufficient documentation

## 2018-03-04 DIAGNOSIS — G894 Chronic pain syndrome: Secondary | ICD-10-CM | POA: Diagnosis not present

## 2018-03-04 DIAGNOSIS — S4491XS Injury of unspecified nerve at shoulder and upper arm level, right arm, sequela: Secondary | ICD-10-CM

## 2018-03-04 DIAGNOSIS — M1711 Unilateral primary osteoarthritis, right knee: Secondary | ICD-10-CM | POA: Insufficient documentation

## 2018-03-04 DIAGNOSIS — Z5181 Encounter for therapeutic drug level monitoring: Secondary | ICD-10-CM | POA: Diagnosis not present

## 2018-03-04 DIAGNOSIS — Z79891 Long term (current) use of opiate analgesic: Secondary | ICD-10-CM | POA: Diagnosis not present

## 2018-03-04 DIAGNOSIS — M4726 Other spondylosis with radiculopathy, lumbar region: Secondary | ICD-10-CM | POA: Diagnosis not present

## 2018-03-04 DIAGNOSIS — M4722 Other spondylosis with radiculopathy, cervical region: Secondary | ICD-10-CM | POA: Diagnosis not present

## 2018-03-04 DIAGNOSIS — M5136 Other intervertebral disc degeneration, lumbar region: Secondary | ICD-10-CM | POA: Diagnosis not present

## 2018-03-04 MED ORDER — OXYCODONE-ACETAMINOPHEN 10-325 MG PO TABS: 1.0000 | ORAL_TABLET | Freq: Three times a day (TID) | ORAL | 0 refills | Status: DC | PRN
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
## 2018-03-19 DIAGNOSIS — E7800 Pure hypercholesterolemia, unspecified: Secondary | ICD-10-CM | POA: Diagnosis not present

## 2018-03-19 DIAGNOSIS — E538 Deficiency of other specified B group vitamins: Secondary | ICD-10-CM | POA: Diagnosis not present

## 2018-03-19 DIAGNOSIS — Z79899 Other long term (current) drug therapy: Secondary | ICD-10-CM | POA: Diagnosis not present

## 2018-03-19 DIAGNOSIS — I10 Essential (primary) hypertension: Secondary | ICD-10-CM | POA: Diagnosis not present

## 2018-03-19 DIAGNOSIS — E118 Type 2 diabetes mellitus with unspecified complications: Secondary | ICD-10-CM | POA: Diagnosis not present

## 2018-03-22 DIAGNOSIS — K219 Gastro-esophageal reflux disease without esophagitis: Secondary | ICD-10-CM | POA: Diagnosis not present

## 2018-03-22 DIAGNOSIS — E538 Deficiency of other specified B group vitamins: Secondary | ICD-10-CM | POA: Diagnosis not present

## 2018-03-22 DIAGNOSIS — M545 Low back pain: Secondary | ICD-10-CM | POA: Diagnosis not present

## 2018-03-22 DIAGNOSIS — M542 Cervicalgia: Secondary | ICD-10-CM | POA: Diagnosis not present

## 2018-03-22 DIAGNOSIS — Z7984 Long term (current) use of oral hypoglycemic drugs: Secondary | ICD-10-CM | POA: Diagnosis not present

## 2018-03-22 DIAGNOSIS — Z79899 Other long term (current) drug therapy: Secondary | ICD-10-CM | POA: Diagnosis not present

## 2018-03-22 DIAGNOSIS — I10 Essential (primary) hypertension: Secondary | ICD-10-CM | POA: Diagnosis not present

## 2018-03-22 DIAGNOSIS — E118 Type 2 diabetes mellitus with unspecified complications: Secondary | ICD-10-CM | POA: Diagnosis not present

## 2018-03-22 DIAGNOSIS — E7800 Pure hypercholesterolemia, unspecified: Secondary | ICD-10-CM | POA: Diagnosis not present

## 2018-03-30 ENCOUNTER — Encounter: Payer: Medicare HMO | Attending: Physical Medicine & Rehabilitation | Admitting: Registered Nurse

## 2018-03-30 ENCOUNTER — Other Ambulatory Visit: Payer: Self-pay

## 2018-03-30 ENCOUNTER — Encounter: Payer: Self-pay | Admitting: Registered Nurse

## 2018-03-30 VITALS — BP 148/92 | HR 69 | Ht 70.0 in | Wt 216.0 lb

## 2018-03-30 DIAGNOSIS — Z79891 Long term (current) use of opiate analgesic: Secondary | ICD-10-CM

## 2018-03-30 DIAGNOSIS — M25561 Pain in right knee: Secondary | ICD-10-CM | POA: Diagnosis not present

## 2018-03-30 DIAGNOSIS — Z87891 Personal history of nicotine dependence: Secondary | ICD-10-CM | POA: Diagnosis not present

## 2018-03-30 DIAGNOSIS — S4491XS Injury of unspecified nerve at shoulder and upper arm level, right arm, sequela: Secondary | ICD-10-CM

## 2018-03-30 DIAGNOSIS — M1711 Unilateral primary osteoarthritis, right knee: Secondary | ICD-10-CM | POA: Insufficient documentation

## 2018-03-30 DIAGNOSIS — M4726 Other spondylosis with radiculopathy, lumbar region: Secondary | ICD-10-CM | POA: Diagnosis not present

## 2018-03-30 DIAGNOSIS — M4722 Other spondylosis with radiculopathy, cervical region: Secondary | ICD-10-CM | POA: Diagnosis not present

## 2018-03-30 DIAGNOSIS — M5136 Other intervertebral disc degeneration, lumbar region: Secondary | ICD-10-CM | POA: Diagnosis not present

## 2018-03-30 DIAGNOSIS — M47812 Spondylosis without myelopathy or radiculopathy, cervical region: Secondary | ICD-10-CM | POA: Diagnosis not present

## 2018-03-30 DIAGNOSIS — Z5181 Encounter for therapeutic drug level monitoring: Secondary | ICD-10-CM

## 2018-03-30 DIAGNOSIS — G894 Chronic pain syndrome: Secondary | ICD-10-CM | POA: Diagnosis present

## 2018-03-30 DIAGNOSIS — R69 Illness, unspecified: Secondary | ICD-10-CM | POA: Diagnosis not present

## 2018-03-30 MED ORDER — OXYCODONE-ACETAMINOPHEN 10-325 MG PO TABS: 1.0000 | ORAL_TABLET | Freq: Three times a day (TID) | ORAL | 0 refills | Status: DC | PRN

## 2018-03-30 NOTE — Progress Notes (Signed)
Subjective:    Patient ID: Erik Taylor, male    DOB: 05-14-1956, 62 y.o.   MRN: [id number]  HPI: [name] is a 62 year old male who returns for follow up appointment for chronic pain and medication refill. He states his pain is located in his neck, right arm reports (nerve pain), lower back radiating into right lower extremity and right knee pain. He rates his pain 8. His current exercise regime is walking.   [name] Morphine Equivalent is 45.00 MME. Last UDS was Performed on 02/01/2018, it was consistent.   Pain Inventory Average Pain 8 Pain Right Now 8 My pain is sharp, burning, dull, stabbing, tingling, aching and pounding  In the last 24 hours, has pain interfered with the following? General activity 8 Relation with others 8 Enjoyment of life 8 What TIME of day is your pain at its worst? all Sleep (in general) Good  Pain is worse with: walking, bending, sitting, inactivity, standing and some activites Pain improves with: rest, heat/ice, therapy/exercise and pacing activities Relief from Meds: 4  Mobility walk without assistance how many minutes can you walk? varies ability to climb steps?  yes do you drive?  yes  Function disabled: date disabled n/a I need assistance with the following:  meal prep, household duties and shopping  Neuro/Psych weakness numbness tingling spasms  Prior Studies Any changes since last visit?  no  Physicians involved in your care Any changes since last visit?  no Primary care Harlene Ramus, NP Neurologist Pearson Grippe, MD   No family history on file. Social History   Socioeconomic History  . Marital status: Married    Spouse name: Not on file  . Number of children: Not on file  . Years of education: Not on file  . Highest education level: Not on file  Occupational History  . Not on file  Social Needs  . Financial resource strain: Not on file  . Food insecurity:    Worry: Not on file    Inability: Not on  file  . Transportation needs:    Medical: Not on file    Non-medical: Not on file  Tobacco Use  . Smoking status: Former Smoker    Types: Cigarettes    Last attempt to quit: 02/01/1994    Years since quitting: 24.1  . Smokeless tobacco: Never Used  Substance and Sexual Activity  . Alcohol use: Not on file  . Drug use: Not on file  . Sexual activity: Not on file  Lifestyle  . Physical activity:    Days per week: Not on file    Minutes per session: Not on file  . Stress: Not on file  Relationships  . Social connections:    Talks on phone: Not on file    Gets together: Not on file    Attends religious service: Not on file    Active member of club or organization: Not on file    Attends meetings of clubs or organizations: Not on file    Relationship status: Not on file  Other Topics Concern  . Not on file  Social History Narrative  . Not on file   No past surgical history on file. No past medical history on file. BP (!) 148/92   Pulse 69   Ht 5\' 10"  (1.778 m)   Wt 216 lb (98 kg)   SpO2 97%   BMI 30.99 kg/m   Opioid Risk Score:   Fall Risk Score:  `  1  Depression screen PHQ 2/9  Depression screen [facility] 2/9 03/30/2018 02/01/2018  Decreased Interest 0 2  Down, Depressed, Hopeless 0 1  PHQ - 2 Score 0 3  Altered sleeping - 2  Tired, decreased energy - 2  Change in appetite - 0  Feeling bad or failure about yourself  - 2  Trouble concentrating - 2  Moving slowly or fidgety/restless - 0  Suicidal thoughts - 0  PHQ-9 Score - 11  Difficult doing work/chores - Very difficult    Review of Systems  Constitutional: Negative.   HENT: Negative.   Eyes: Negative.   Respiratory: Negative.   Cardiovascular: Negative.   Gastrointestinal: Negative.   Endocrine: Negative.   Genitourinary: Negative.   Musculoskeletal: Negative.   Skin: Negative.   Allergic/Immunologic: Negative.   Neurological: Negative.   Hematological: Negative.   Psychiatric/Behavioral: Negative.     All other systems reviewed and are negative.      Objective:   Physical Exam  Constitutional: He is oriented to person, place, and time. He appears well-developed and well-nourished.  HENT:  Head: Normocephalic and atraumatic.  Neck: Normal range of motion. Neck supple.  Cervical Paraspinal Tenderness: C-5-C-6  Cardiovascular: Normal rate and regular rhythm.  Pulmonary/Chest: Effort normal and breath sounds normal.  Musculoskeletal:  Normal Muscle Bulk and Muscle Testing Reveals: Upper Extremities: Right: Decreased ROM 45 Degrees and Muscle Strength 4/5 Bilateral AC Joint Tenderness Left: Full ROM and Muscle Strength 5/5 Lumbar Paraspinal Tenderness: L-3-L-5 Lower Extremities: Full ROM and Muscle Strength 5/5 Arises from chair with ease Narrow Based Gait  Neurological: He is alert and oriented to person, place, and time.  Skin: Skin is warm and dry.  Psychiatric: He has a normal mood and affect. His behavior is normal.  Nursing note and vitals reviewed.         Assessment & Plan:  1. Cervical Spondylosis with Radiculopathy: Continue HEP as Tolerated and Continue Tegretol. 03/30/2018. 2. Lumbar Spondylosis with Radiculopathy. Continue HEP as Tolerated and Continue Tegretol. 03/30/2018. 3. Injury of right shoulder and right  upper arm. Continue HEP as Tolerated. Continue to Monitor. 03/30/2018. 4. Chronic Pain Syndrome: Refilled:  Oxycodone 10/325 mg one tablet every 8 hours as needed for pain #90.   20 minutes of face to face patient care time was spent during this visit. All questions were encouraged and answered.   F/U in 1 month.

## 2018-04-19 DIAGNOSIS — E538 Deficiency of other specified B group vitamins: Secondary | ICD-10-CM | POA: Diagnosis not present

## 2018-04-29 ENCOUNTER — Encounter: Payer: Medicare HMO | Admitting: Registered Nurse

## 2018-04-29 ENCOUNTER — Encounter: Payer: Self-pay | Admitting: Registered Nurse

## 2018-04-29 ENCOUNTER — Encounter: Payer: Medicare HMO | Attending: Physical Medicine & Rehabilitation | Admitting: Registered Nurse

## 2018-04-29 VITALS — BP 158/91 | HR 75 | Ht 70.0 in | Wt 216.4 lb

## 2018-04-29 DIAGNOSIS — M1711 Unilateral primary osteoarthritis, right knee: Secondary | ICD-10-CM | POA: Diagnosis not present

## 2018-04-29 DIAGNOSIS — M47812 Spondylosis without myelopathy or radiculopathy, cervical region: Secondary | ICD-10-CM | POA: Insufficient documentation

## 2018-04-29 DIAGNOSIS — M4726 Other spondylosis with radiculopathy, lumbar region: Secondary | ICD-10-CM

## 2018-04-29 DIAGNOSIS — M4722 Other spondylosis with radiculopathy, cervical region: Secondary | ICD-10-CM

## 2018-04-29 DIAGNOSIS — Z5181 Encounter for therapeutic drug level monitoring: Secondary | ICD-10-CM

## 2018-04-29 DIAGNOSIS — M5136 Other intervertebral disc degeneration, lumbar region: Secondary | ICD-10-CM | POA: Diagnosis not present

## 2018-04-29 DIAGNOSIS — Z79891 Long term (current) use of opiate analgesic: Secondary | ICD-10-CM | POA: Diagnosis not present

## 2018-04-29 DIAGNOSIS — Z87891 Personal history of nicotine dependence: Secondary | ICD-10-CM | POA: Diagnosis not present

## 2018-04-29 DIAGNOSIS — S4491XS Injury of unspecified nerve at shoulder and upper arm level, right arm, sequela: Secondary | ICD-10-CM | POA: Diagnosis not present

## 2018-04-29 DIAGNOSIS — G894 Chronic pain syndrome: Secondary | ICD-10-CM | POA: Insufficient documentation

## 2018-04-29 DIAGNOSIS — M792 Neuralgia and neuritis, unspecified: Secondary | ICD-10-CM

## 2018-04-29 DIAGNOSIS — M542 Cervicalgia: Secondary | ICD-10-CM

## 2018-04-29 MED ORDER — TOPIRAMATE 25 MG PO TABS: 25.0000 mg | ORAL_TABLET | Freq: Every day | ORAL | 2 refills | Status: DC

## 2018-04-29 MED ORDER — OXYCODONE-ACETAMINOPHEN 10-325 MG PO TABS: 1.0000 | ORAL_TABLET | Freq: Three times a day (TID) | ORAL | 0 refills | Status: DC | PRN

## 2018-04-29 NOTE — Patient Instructions (Signed)
Call office on Monday to evaluate  Medication Management : [phone]

## 2018-04-29 NOTE — Progress Notes (Signed)
Subjective:    Patient ID: Erik Taylor, male    DOB: 03/06/1956, 62 y.o.   MRN: [id number]  HPI: [name] is a 62 year old male who returns for follow up appointment for chronic pain and medication refill. He states his pain is located in his neck radiating into his right shoulder and right arm with tingling and burning, also reports he had stopped taking the Tegretol last dose was two weeks ago, he stated it was due to  ineffectiveness. Today we will prescribe Topamax for his neuropathic pain he was instructed to call office on Monday to evaluate medication changes, he verbalizes understanding.  Also reports lower back pain. He rates his pain 6.   Mr. Feil Morphine Equivalent is 45.00 MME. Last UDS was Performed on 02/01/2018, see note for detils. UDS ordered today.   Pain Inventory Average Pain 7 Pain Right Now 6 My pain is sharp, burning, dull, stabbing, tingling and aching  In the last 24 hours, has pain interfered with the following? General activity 4 Relation with others 4 Enjoyment of life 7 What TIME of day is your pain at its worst? daytime Sleep (in general) Fair  Pain is worse with: walking, bending, standing and some activites Pain improves with: rest, heat/ice and medication Relief from Meds: 5  Mobility walk without assistance ability to climb steps?  yes do you drive?  yes  Function disabled: date disabled 45 I need assistance with the following:  meal prep, household duties and shopping  Neuro/Psych weakness numbness tingling trouble walking depression  Prior Studies Any changes since last visit?  no  Physicians involved in your care Any changes since last visit?  no   No family history on file. Social History   Socioeconomic History  . Marital status: Married    Spouse name: Not on file  . Number of children: Not on file  . Years of education: Not on file  . Highest education level: Not on file  Occupational History  .  Not on file  Social Needs  . Financial resource strain: Not on file  . Food insecurity:    Worry: Not on file    Inability: Not on file  . Transportation needs:    Medical: Not on file    Non-medical: Not on file  Tobacco Use  . Smoking status: Former Smoker    Types: Cigarettes    Last attempt to quit: 02/01/1994    Years since quitting: 24.2  . Smokeless tobacco: Never Used  Substance and Sexual Activity  . Alcohol use: Not on file  . Drug use: Not on file  . Sexual activity: Not on file  Lifestyle  . Physical activity:    Days per week: Not on file    Minutes per session: Not on file  . Stress: Not on file  Relationships  . Social connections:    Talks on phone: Not on file    Gets together: Not on file    Attends religious service: Not on file    Active member of club or organization: Not on file    Attends meetings of clubs or organizations: Not on file    Relationship status: Not on file  Other Topics Concern  . Not on file  Social History Narrative  . Not on file   No past surgical history on file. No past medical history on file. BP (!) 158/91   Pulse 75   Ht 5\' 10"  (1.778 m)  Wt 216 lb 6.4 oz (98.2 kg)   SpO2 98%   BMI 31.05 kg/m   Opioid Risk Score:   Fall Risk Score:  `1  Depression screen PHQ 2/9  Depression screen [facility] 2/9 03/30/2018 02/01/2018  Decreased Interest 0 2  Down, Depressed, Hopeless 0 1  PHQ - 2 Score 0 3  Altered sleeping - 2  Tired, decreased energy - 2  Change in appetite - 0  Feeling bad or failure about yourself  - 2  Trouble concentrating - 2  Moving slowly or fidgety/restless - 0  Suicidal thoughts - 0  PHQ-9 Score - 11  Difficult doing work/chores - Very difficult     Review of Systems  Constitutional: Negative.   HENT: Negative.   Eyes: Negative.   Respiratory: Negative.   Cardiovascular: Negative.   Gastrointestinal: Negative.   Endocrine: Negative.   Genitourinary: Negative.   Musculoskeletal: Positive for  arthralgias, back pain, joint swelling, myalgias and neck pain.  Skin: Negative.   Allergic/Immunologic: Negative.   Neurological: Positive for weakness and numbness.  Hematological: Negative.   Psychiatric/Behavioral: Positive for dysphoric mood.  All other systems reviewed and are negative.      Objective:   Physical Exam  Constitutional: He is oriented to person, place, and time. He appears well-developed and well-nourished.  HENT:  Head: Normocephalic and atraumatic.  Neck: Normal range of motion. Neck supple.  Cervical Paraspinal Tenderness: C-5-C-6  Cardiovascular: Normal rate and regular rhythm.  Pulmonary/Chest: Effort normal and breath sounds normal.  Musculoskeletal:  Normal Muscle Bulk and Muscle Testing Reveals:  Upper Extremities: Right: Full ROM and Muscle Strength 4/5 and Left: Full ROM and Muscle Strength 5/5 Bilateral AC Joint Tenderness Lumbar Paraspinal Tenderness: L-3-L-5 Lower Extremities: Full ROM and Muscle Strength 5/5 Arises from chair with ease Narrow Based gait  Neurological: He is alert and oriented to person, place, and time.  Skin: Skin is warm and dry.  Psychiatric: He has a normal mood and affect. His behavior is normal.  Nursing note and vitals reviewed.         Assessment & Plan:  1. Cervical Spondylosis with Radiculopathy: Continue HEP as Tolerated and RX: Topamax:  Tegretol discontinued due to ineffectiveness. . 04/29/2018. 2. Lumbar Spondylosis with Radiculopathy. Continue HEP as Tolerated and Topamax: Mr. Stille reports Gabapentin and Lyrica ineffective. . 04/29/2018. 3. Injury of right shoulder and right upper arm. Continue HEP as Tolerated. Continue to Monitor. 04/29/2018. 4. Chronic Pain Syndrome: Refilled:  Oxycodone 10/325 mg one tablet every 8 hours as needed for pain #90.   20 minutes of face to face patient care time was spent during this visit. All questions were encouraged and answered.   F/U in 1  month.

## 2018-05-05 LAB — TOXASSURE SELECT,+ANTIDEPR,UR

## 2018-05-10 ENCOUNTER — Telehealth: Payer: Self-pay | Admitting: *Deleted

## 2018-05-10 NOTE — Telephone Encounter (Signed)
Urine drug screen for this encounter is consistent for prescribed medication 

## 2018-05-30 ENCOUNTER — Encounter: Payer: Self-pay | Admitting: Registered Nurse

## 2018-05-30 ENCOUNTER — Encounter: Payer: Medicare HMO | Attending: Physical Medicine & Rehabilitation | Admitting: Registered Nurse

## 2018-05-30 ENCOUNTER — Telehealth: Payer: Self-pay | Admitting: Registered Nurse

## 2018-05-30 VITALS — BP 134/88 | HR 81 | Ht 70.0 in | Wt 209.4 lb

## 2018-05-30 DIAGNOSIS — M542 Cervicalgia: Secondary | ICD-10-CM | POA: Diagnosis not present

## 2018-05-30 DIAGNOSIS — M47812 Spondylosis without myelopathy or radiculopathy, cervical region: Secondary | ICD-10-CM | POA: Diagnosis not present

## 2018-05-30 DIAGNOSIS — Z79891 Long term (current) use of opiate analgesic: Secondary | ICD-10-CM

## 2018-05-30 DIAGNOSIS — S4491XS Injury of unspecified nerve at shoulder and upper arm level, right arm, sequela: Secondary | ICD-10-CM | POA: Diagnosis not present

## 2018-05-30 DIAGNOSIS — M1711 Unilateral primary osteoarthritis, right knee: Secondary | ICD-10-CM | POA: Insufficient documentation

## 2018-05-30 DIAGNOSIS — M5136 Other intervertebral disc degeneration, lumbar region: Secondary | ICD-10-CM | POA: Insufficient documentation

## 2018-05-30 DIAGNOSIS — Z87891 Personal history of nicotine dependence: Secondary | ICD-10-CM | POA: Insufficient documentation

## 2018-05-30 DIAGNOSIS — M5416 Radiculopathy, lumbar region: Secondary | ICD-10-CM

## 2018-05-30 DIAGNOSIS — Z5181 Encounter for therapeutic drug level monitoring: Secondary | ICD-10-CM

## 2018-05-30 DIAGNOSIS — G894 Chronic pain syndrome: Secondary | ICD-10-CM | POA: Diagnosis not present

## 2018-05-30 MED ORDER — OXYCODONE-ACETAMINOPHEN 10-325 MG PO TABS: 1.0000 | ORAL_TABLET | Freq: Three times a day (TID) | ORAL | 0 refills | Status: DC | PRN

## 2018-05-30 NOTE — Progress Notes (Signed)
Subjective:    Patient ID: Erik Taylor, male    DOB: [birth year], 62 y.o.   MRN: [id number]  HPI: [name] is a 62 year old male who returns for follow up appointment for chronic pain and medication refill. He states his pain is located in his neck, lower back and radiating into his right lower extremities occasionally. He rates his pain 7. His current exercise regime is walking.  Erik Taylor reports the Topamax is helping his neuropathic pain.   Erik Taylor Morphine Equivalent is 45.00 MME. Last UDS was Performed on 04/29/2018, it was consistent.    Pain Inventory Average Pain 7 Pain Right Now 7 My pain is constant, sharp, burning, dull, stabbing, tingling and aching  In the last 24 hours, has pain interfered with the following? General activity 5 Relation with others 3 Enjoyment of life 6 What TIME of day is your pain at its worst? all Sleep (in general) Fair  Pain is worse with: walking, bending, sitting, inactivity and standing Pain improves with: rest, heat/ice and medication Relief from Meds: 5  Mobility walk without assistance ability to climb steps?  yes do you drive?  yes  Function disabled: date disabled 31 I need assistance with the following:  household duties and shopping  Neuro/Psych weakness numbness tingling spasms  Prior Studies Any changes since last visit?  no  Physicians involved in your care Any changes since last visit?  no   No family history on file. Social History   Socioeconomic History  . Marital status: Married    Spouse name: Not on file  . Number of children: Not on file  . Years of education: Not on file  . Highest education level: Not on file  Occupational History  . Not on file  Social Needs  . Financial resource strain: Not on file  . Food insecurity:    Worry: Not on file    Inability: Not on file  . Transportation needs:    Medical: Not on file    Non-medical: Not on file  Tobacco Use  . Smoking  status: Former Smoker    Types: Cigarettes    Last attempt to quit: 02/01/1994    Years since quitting: 24.3  . Smokeless tobacco: Never Used  Substance and Sexual Activity  . Alcohol use: Not on file  . Drug use: Not on file  . Sexual activity: Not on file  Lifestyle  . Physical activity:    Days per week: Not on file    Minutes per session: Not on file  . Stress: Not on file  Relationships  . Social connections:    Talks on phone: Not on file    Gets together: Not on file    Attends religious service: Not on file    Active member of club or organization: Not on file    Attends meetings of clubs or organizations: Not on file    Relationship status: Not on file  Other Topics Concern  . Not on file  Social History Narrative  . Not on file   No past surgical history on file. No past medical history on file. BP 134/88   Pulse 81   Ht 5\' 10"  (1.778 m)   Wt 209 lb 6.4 oz (95 kg) Comment: with jacket and boots  SpO2 99%   BMI 30.05 kg/m   Opioid Risk Score:   Fall Risk Score:  `1  Depression screen PHQ 2/9  Depression screen [facility] 2/9  05/30/2018 03/30/2018 02/01/2018  Decreased Interest 0 0 2  Down, Depressed, Hopeless 0 0 1  PHQ - 2 Score 0 0 3  Altered sleeping - - 2  Tired, decreased energy - - 2  Change in appetite - - 0  Feeling bad or failure about yourself  - - 2  Trouble concentrating - - 2  Moving slowly or fidgety/restless - - 0  Suicidal thoughts - - 0  PHQ-9 Score - - 11  Difficult doing work/chores - - Very difficult   Review of Systems  Constitutional: Negative.   HENT: Negative.   Eyes: Negative.   Respiratory: Negative.   Cardiovascular: Negative.   Gastrointestinal: Negative.   Endocrine: Negative.   Genitourinary: Negative.   Musculoskeletal:       Spasms   Skin: Negative.   Allergic/Immunologic: Negative.   Neurological: Positive for numbness.       Tingling  Hematological: Negative.   Psychiatric/Behavioral: Negative.   All other  systems reviewed and are negative.      Objective:   Physical Exam  Constitutional: He is oriented to person, place, and time. He appears well-developed and well-nourished.  HENT:  Head: Normocephalic and atraumatic.  Neck: Normal range of motion. Neck supple.  Cardiovascular: Normal rate and regular rhythm.  Pulmonary/Chest: Effort normal and breath sounds normal.  Musculoskeletal:  Normal Muscle Bulk and Muscle Testing Reveals: Upper Extremities: Full ROM and Muscle Strength on the Right 4/5 and Left 5/5 Bilateral AC Joint Tenderness Lumbar Paraspinal Tenderness: L-3-L-5 Lower Extremities: Full ROM and Muscle Strength 5/5 Arises from chair with ease Narrow Based Gait   Neurological: He is alert and oriented to person, place, and time.  Skin: Skin is warm and dry.  Psychiatric: He has a normal mood and affect. His behavior is normal.  Nursing note and vitals reviewed.         Assessment & Plan:  1. Cervical Spondylosis with Radiculopathy: Continue HEP as Tolerated and [place] : Topamax: 05/30/2018. 2. Lumbar Spondylosis with Radiculopathy. Continue HEP as Tolerated and Topamax: Erik Taylor reports Gabapentin and Lyrica ineffective. . 05/30/2018. 3. Injury of right shoulder and right upper arm. Continue HEP as Tolerated. Continue to Monitor.05/30/2018. 4. Chronic Pain Syndrome: Refilled: Oxycodone 10/325 mg one tablet every 8 hours as needed for pain #90. 05/30/2018.  20 minutes of face to face patient care time was spent during this visit. All questions were encouraged and answered.   F/U in 1 month.

## 2018-05-30 NOTE — Telephone Encounter (Signed)
[facility] [address]. Oxycodone prescription was removed. [facility] [address]. Placed a call to [name] he is aware of the above.

## 2018-05-30 NOTE — Telephone Encounter (Signed)
[facility] [place] [place] on [address] is out of stock. [facility] [place] [place] on [address].

## 2018-06-22 ENCOUNTER — Encounter: Payer: Medicare HMO | Attending: Physical Medicine & Rehabilitation | Admitting: Physical Medicine & Rehabilitation

## 2018-06-22 ENCOUNTER — Encounter: Payer: Self-pay | Admitting: Physical Medicine & Rehabilitation

## 2018-06-22 ENCOUNTER — Telehealth: Payer: Self-pay

## 2018-06-22 VITALS — BP 131/81 | HR 76 | Ht 70.0 in | Wt 212.0 lb

## 2018-06-22 DIAGNOSIS — M4726 Other spondylosis with radiculopathy, lumbar region: Secondary | ICD-10-CM | POA: Diagnosis not present

## 2018-06-22 DIAGNOSIS — Z87891 Personal history of nicotine dependence: Secondary | ICD-10-CM | POA: Diagnosis not present

## 2018-06-22 DIAGNOSIS — M5136 Other intervertebral disc degeneration, lumbar region: Secondary | ICD-10-CM | POA: Insufficient documentation

## 2018-06-22 DIAGNOSIS — M1711 Unilateral primary osteoarthritis, right knee: Secondary | ICD-10-CM | POA: Insufficient documentation

## 2018-06-22 DIAGNOSIS — S4491XS Injury of unspecified nerve at shoulder and upper arm level, right arm, sequela: Secondary | ICD-10-CM

## 2018-06-22 DIAGNOSIS — G894 Chronic pain syndrome: Secondary | ICD-10-CM | POA: Diagnosis not present

## 2018-06-22 DIAGNOSIS — M47812 Spondylosis without myelopathy or radiculopathy, cervical region: Secondary | ICD-10-CM | POA: Insufficient documentation

## 2018-06-22 DIAGNOSIS — M4722 Other spondylosis with radiculopathy, cervical region: Secondary | ICD-10-CM

## 2018-06-22 MED ORDER — OXYMORPHONE HCL 10 MG PO TABS: 10.0000 mg | ORAL_TABLET | Freq: Three times a day (TID) | ORAL | 0 refills | Status: DC | PRN

## 2018-06-22 MED ORDER — TOPIRAMATE 25 MG PO TABS: 25.0000 mg | ORAL_TABLET | Freq: Every day | ORAL | 2 refills | Status: DC

## 2018-06-22 NOTE — Telephone Encounter (Signed)
[facility] [facility] [place] called stating that they are out of stock of Oxymorphone 10 mg but [facility] [facility] [place] [facility] it in stock. They will delete prescription so it can be sent to another pharmacy.

## 2018-06-22 NOTE — Progress Notes (Signed)
Subjective:    Patient ID: Erik NgJohnny R Taylor, male    DOB: 07/01/1956, 62 y.o.   MRN: [id number]  HPI   Erik Taylor is here in follow up of his chronic pain. Pain has been more intense with the cooler weather or when it rains. He remains on percocet for pain control every 8 hours as needed. He says that it's not lasting him the full 8 hours and is often having pain about 4 hours between doses. He remembers being on opana in the past which seemed to last longer than his percocet is. He asked if it was an option to change to this  Pain is most severe in his neck with radiation into the right shoulder/arm. His low back also is problematic with activity and standing/walking  topamax has helped with sleep from a standpoint of pain in his right arm. He is satisfied with the 25mg  dose.     Pain Inventory Average Pain 7 Pain Right Now 7 My pain is intermittent, constant, sharp, burning, dull, stabbing, tingling and aching  In the last 24 hours, has pain interfered with the following? General activity 6 Relation with others 4 Enjoyment of life 6 What TIME of day is your pain at its worst? daytime Sleep (in general) Fair  Pain is worse with: walking, bending, sitting, inactivity, standing, unsure and some activites Pain improves with: rest, heat/ice and medication Relief from Meds: 6  Mobility walk without assistance ability to climb steps?  yes do you drive?  yes  Function disabled: date disabled 1999 I need assistance with the following:  meal prep, household duties and shopping  Neuro/Psych weakness numbness tingling spasms  Prior Studies Any changes since last visit?  no  Physicians involved in your care Any changes since last visit?  no   No family history on file. Social History   Socioeconomic History  . Marital status: Married    Spouse name: Not on file  . Number of children: Not on file  . Years of education: Not on file  . Highest education level: Not on  file  Occupational History  . Not on file  Social Needs  . Financial resource strain: Not on file  . Food insecurity:    Worry: Not on file    Inability: Not on file  . Transportation needs:    Medical: Not on file    Non-medical: Not on file  Tobacco Use  . Smoking status: Former Smoker    Types: Cigarettes    Last attempt to quit: 02/01/1994    Years since quitting: 24.4  . Smokeless tobacco: Never Used  Substance and Sexual Activity  . Alcohol use: Not on file  . Drug use: Not on file  . Sexual activity: Not on file  Lifestyle  . Physical activity:    Days per week: Not on file    Minutes per session: Not on file  . Stress: Not on file  Relationships  . Social connections:    Talks on phone: Not on file    Gets together: Not on file    Attends religious service: Not on file    Active member of club or organization: Not on file    Attends meetings of clubs or organizations: Not on file    Relationship status: Not on file  Other Topics Concern  . Not on file  Social History Narrative  . Not on file   No past surgical history on file. No past medical history  on file. BP 131/81   Pulse 76   Ht 5\' 10"  (1.778 m)   Wt 212 lb (96.2 kg)   SpO2 98%   BMI 30.42 kg/m   Opioid Risk Score:   Fall Risk Score:  `1  Depression screen PHQ 2/9  Depression screen [facility] 2/9 05/30/2018 03/30/2018 02/01/2018  Decreased Interest 0 0 2  Down, Depressed, Hopeless 0 0 1  PHQ - 2 Score 0 0 3  Altered sleeping - - 2  Tired, decreased energy - - 2  Change in appetite - - 0  Feeling bad or failure about yourself  - - 2  Trouble concentrating - - 2  Moving slowly or fidgety/restless - - 0  Suicidal thoughts - - 0  PHQ-9 Score - - 11  Difficult doing work/chores - - Very difficult     Review of Systems  Constitutional: Negative.   HENT: Negative.   Eyes: Negative.   Respiratory: Negative.   Cardiovascular: Negative.   Gastrointestinal: Negative.   Endocrine: Negative.     Genitourinary: Negative.   Musculoskeletal: Positive for arthralgias, back pain and myalgias.  Skin: Negative.   Allergic/Immunologic: Negative.   Neurological: Positive for weakness and numbness.  Hematological: Negative.   Psychiatric/Behavioral: Negative.   All other systems reviewed and are negative.      Objective:   Physical Exam  General: No acute distress HEENT: EOMI, oral membranes moist Cards: reg rate  Chest: normal effort Abdomen: Soft, NT, ND Skin: dry, intact Extremities: no edema Musculoskeletal: functional ROM in neck. Tender along mid to lower neck with palpation. +impingement signs right shoulder.    Neurological: He is alert and oriented to person, place, and time. RUE limited due to pain. Strength generally 4-5/5 Skin: Skin is warm and dry.  Psychiatric:  Pleasant and cooperative         Assessment & Plan:  1. Cervical Spondylosis with Radiculopathy:  Continue  topiramate at [facility]  -Dr Lovell Sheehan is following him from a NS standpoint 2. Lumbar Spondylosis with Radiculopathy.  Continue topiramate hs  3. Injury of right shoulder and right upper arm. Continue HEP as Tolerated. Continue to Monitor.   4. Chronic Pain Syndrome:  Oxycodone 10/325 mg one tablet every 8 hours as needed for pain #90. --WILL REPLACE TODAY WITH: Opana 10mg  IR q8 prn #90  -We will continue the controlled substance monitoring program, this consists of regular clinic visits, examinations, routine drug screening, pill counts as well as use of [place] Controlled Substance Reporting System. [place].    15 minutes of face to face patient care time was spent during this visit. All questions were encouraged and answered.   F/U in 1 month with NP.

## 2018-06-22 NOTE — Addendum Note (Signed)
Addended by: Faith RogueSWARTZ, Shea Kapur T on: 06/22/2018 12:56 PM   Modules accepted: Orders

## 2018-06-22 NOTE — Patient Instructions (Signed)
PLEASE FEEL FREE TO CALL OUR OFFICE WITH ANY PROBLEMS OR QUESTIONS ([phone])      

## 2018-06-23 ENCOUNTER — Ambulatory Visit: Payer: Medicare HMO | Admitting: Registered Nurse

## 2018-07-22 ENCOUNTER — Encounter: Payer: Medicare HMO | Attending: Physical Medicine & Rehabilitation | Admitting: Registered Nurse

## 2018-07-22 ENCOUNTER — Encounter: Payer: Self-pay | Admitting: Registered Nurse

## 2018-07-22 VITALS — BP 151/95 | HR 74 | Ht 70.0 in | Wt 205.0 lb

## 2018-07-22 DIAGNOSIS — M1711 Unilateral primary osteoarthritis, right knee: Secondary | ICD-10-CM | POA: Diagnosis not present

## 2018-07-22 DIAGNOSIS — M5136 Other intervertebral disc degeneration, lumbar region: Secondary | ICD-10-CM | POA: Diagnosis not present

## 2018-07-22 DIAGNOSIS — M47812 Spondylosis without myelopathy or radiculopathy, cervical region: Secondary | ICD-10-CM | POA: Diagnosis not present

## 2018-07-22 DIAGNOSIS — S4491XS Injury of unspecified nerve at shoulder and upper arm level, right arm, sequela: Secondary | ICD-10-CM

## 2018-07-22 DIAGNOSIS — Z87891 Personal history of nicotine dependence: Secondary | ICD-10-CM | POA: Insufficient documentation

## 2018-07-22 DIAGNOSIS — M542 Cervicalgia: Secondary | ICD-10-CM | POA: Diagnosis not present

## 2018-07-22 DIAGNOSIS — M545 Low back pain: Secondary | ICD-10-CM | POA: Diagnosis not present

## 2018-07-22 DIAGNOSIS — G894 Chronic pain syndrome: Secondary | ICD-10-CM

## 2018-07-22 DIAGNOSIS — Z79891 Long term (current) use of opiate analgesic: Secondary | ICD-10-CM

## 2018-07-22 DIAGNOSIS — Z5181 Encounter for therapeutic drug level monitoring: Secondary | ICD-10-CM

## 2018-07-22 DIAGNOSIS — G8929 Other chronic pain: Secondary | ICD-10-CM

## 2018-07-22 DIAGNOSIS — M4726 Other spondylosis with radiculopathy, lumbar region: Secondary | ICD-10-CM

## 2018-07-22 DIAGNOSIS — M4722 Other spondylosis with radiculopathy, cervical region: Secondary | ICD-10-CM | POA: Diagnosis not present

## 2018-07-22 MED ORDER — OXYMORPHONE HCL 10 MG PO TABS: 10.0000 mg | ORAL_TABLET | Freq: Three times a day (TID) | ORAL | 0 refills | Status: DC | PRN

## 2018-07-22 NOTE — Progress Notes (Signed)
Subjective:    Patient ID: Erik Taylor, male    DOB: [birth year], 63 y.o.   MRN: [id number]  HPI: Erik Taylor is a 63 y.o. male who returns for follow up appointment for chronic pain and medication refill. He states his  pain is located in his neck radiating into her right shoulder and lower back pain. He rates his pain 7. His current exercise regime is walking and performing stretching exercises.  Erik Taylor reports his pain being controlled with the change in his analgesics.   Erik Taylor Morphine equivalent is 90.00 MME.  Last UDS was Performed on 04/29/2018, it was consistent.   Pain Inventory Average Pain 7 Pain Right Now 7 My pain is sharp, burning, dull, stabbing, tingling and aching  In the last 24 hours, has pain interfered with the following? General activity 7 Relation with others 4 Enjoyment of life 7 What TIME of day is your pain at its worst? daytime Sleep (in general) Fair  Pain is worse with: walking, bending, sitting, inactivity, standing and some activites Pain improves with: rest, heat/ice and medication Relief from Meds: 8  Mobility walk without assistance ability to climb steps?  yes do you drive?  yes  Function disabled: date disabled 77 I need assistance with the following:  meal prep, household duties and shopping  Neuro/Psych weakness numbness spasms  Prior Studies Any changes since last visit?  no  Physicians involved in your care Any changes since last visit?  no   No family history on file. Social History   Socioeconomic History  . Marital status: Married    Spouse name: Not on file  . Number of children: Not on file  . Years of education: Not on file  . Highest education level: Not on file  Occupational History  . Not on file  Social Needs  . Financial resource strain: Not on file  . Food insecurity:    Worry: Not on file    Inability: Not on file  . Transportation needs:    Medical: Not on file    Non-medical:  Not on file  Tobacco Use  . Smoking status: Former Smoker    Types: Cigarettes    Last attempt to quit: 02/01/1994    Years since quitting: 24.4  . Smokeless tobacco: Never Used  Substance and Sexual Activity  . Alcohol use: Not on file  . Drug use: Not on file  . Sexual activity: Not on file  Lifestyle  . Physical activity:    Days per week: Not on file    Minutes per session: Not on file  . Stress: Not on file  Relationships  . Social connections:    Talks on phone: Not on file    Gets together: Not on file    Attends religious service: Not on file    Active member of club or organization: Not on file    Attends meetings of clubs or organizations: Not on file    Relationship status: Not on file  Other Topics Concern  . Not on file  Social History Narrative  . Not on file   No past surgical history on file. No past medical history on file. There were no vitals taken for this visit.  Opioid Risk Score:   Fall Risk Score:  `1  Depression screen PHQ 2/9  Depression screen [facility] 2/9 05/30/2018 03/30/2018 02/01/2018  Decreased Interest 0 0 2  Down, Depressed, Hopeless 0 0 1  PHQ - 2 Score  0 0 3  Altered sleeping - - 2  Tired, decreased energy - - 2  Change in appetite - - 0  Feeling bad or failure about yourself  - - 2  Trouble concentrating - - 2  Moving slowly or fidgety/restless - - 0  Suicidal thoughts - - 0  PHQ-9 Score - - 11  Difficult doing work/chores - - Very difficult     Review of Systems  Constitutional: Negative.   HENT: Negative.   Eyes: Negative.   Respiratory: Negative.   Cardiovascular: Negative.   Gastrointestinal: Negative.   Endocrine: Negative.   Genitourinary: Negative.   Musculoskeletal: Positive for arthralgias, back pain, joint swelling, myalgias and neck pain.  Skin: Negative.   Allergic/Immunologic: Negative.   Neurological: Positive for weakness and numbness.  Hematological: Negative.   Psychiatric/Behavioral: Negative.   All  other systems reviewed and are negative.      Objective:   Physical Exam Vitals signs and nursing note reviewed.  Constitutional:      Appearance: Normal appearance.  Neck:     Musculoskeletal: Normal range of motion and neck supple.  Cardiovascular:     Rate and Rhythm: Normal rate and regular rhythm.     Pulses: Normal pulses.     Heart sounds: Normal heart sounds.  Pulmonary:     Effort: Pulmonary effort is normal.     Breath sounds: Normal breath sounds.  Musculoskeletal:     Comments: Normal Muscle Bulk and Muscle Testing Reveals:  Upper Extremities: Decreased ROM 90 Degrees  and Muscle Strength on the Right 4/5 and Left 5/5 Lumbar Paraspinal Tenderness: L-3-L-5 Lower Extremities: Full ROM and Muscle Strength 5/5 Arises form chair with ease Narrow Based Gait   Skin:    General: Skin is warm and dry.  Neurological:     Mental Status: He is alert and oriented to person, place, and time.  Psychiatric:        Mood and Affect: Mood normal.        Behavior: Behavior normal.           Assessment & Plan:  1. Cervical Spondylosis with Radiculopathy: Continue HEP as Tolerated and  Topamax: 07/22/2018. 2. Lumbar Spondylosis with Radiculopathy. Continue HEP as Tolerated and Topamax: Erik Taylor reports Gabapentin and Lyrica ineffective. 07/22/2018 3. Injury of right shoulder and right upper arm. Continue HEP as Tolerated. Continue to Monitor.07/22/2018. 4. Chronic Pain Syndrome: Refilled: Oxymorphone 10 mg one tablet every 8 hours as needed for pain #90.  We will continue the opioid monitoring program, this consists of regular clinic visits, examinations, urine drug screen, pill counts as well as use of [place] Controlled Substance Reporting system.  20 minutes of face to face patient care time was spent during this visit. All questions were encouraged and answered.   F/U in 1 month.

## 2018-08-22 ENCOUNTER — Encounter: Payer: Self-pay | Admitting: Registered Nurse

## 2018-08-22 ENCOUNTER — Encounter: Payer: Medicare HMO | Attending: Physical Medicine & Rehabilitation | Admitting: Registered Nurse

## 2018-08-22 VITALS — BP 134/83 | HR 69 | Resp 14 | Ht 70.0 in | Wt 205.0 lb

## 2018-08-22 DIAGNOSIS — Z79891 Long term (current) use of opiate analgesic: Secondary | ICD-10-CM

## 2018-08-22 DIAGNOSIS — S4491XS Injury of unspecified nerve at shoulder and upper arm level, right arm, sequela: Secondary | ICD-10-CM | POA: Diagnosis not present

## 2018-08-22 DIAGNOSIS — M5136 Other intervertebral disc degeneration, lumbar region: Secondary | ICD-10-CM | POA: Insufficient documentation

## 2018-08-22 DIAGNOSIS — Z5181 Encounter for therapeutic drug level monitoring: Secondary | ICD-10-CM

## 2018-08-22 DIAGNOSIS — Z87891 Personal history of nicotine dependence: Secondary | ICD-10-CM | POA: Insufficient documentation

## 2018-08-22 DIAGNOSIS — M4722 Other spondylosis with radiculopathy, cervical region: Secondary | ICD-10-CM | POA: Diagnosis not present

## 2018-08-22 DIAGNOSIS — M542 Cervicalgia: Secondary | ICD-10-CM

## 2018-08-22 DIAGNOSIS — M4726 Other spondylosis with radiculopathy, lumbar region: Secondary | ICD-10-CM

## 2018-08-22 DIAGNOSIS — M47812 Spondylosis without myelopathy or radiculopathy, cervical region: Secondary | ICD-10-CM | POA: Diagnosis not present

## 2018-08-22 DIAGNOSIS — M1711 Unilateral primary osteoarthritis, right knee: Secondary | ICD-10-CM | POA: Insufficient documentation

## 2018-08-22 DIAGNOSIS — G894 Chronic pain syndrome: Secondary | ICD-10-CM | POA: Diagnosis present

## 2018-08-22 MED ORDER — OXYMORPHONE HCL 10 MG PO TABS: 10.0000 mg | ORAL_TABLET | Freq: Three times a day (TID) | ORAL | 0 refills | Status: DC | PRN

## 2018-08-22 NOTE — Progress Notes (Signed)
Subjective:    Patient ID: Erik Taylor, male    DOB: 01-27-1956, 63 y.o.   MRN: [id number]  HPI: Erik Taylor is a 63 y.o. male who returns for follow up appointment for chronic pain and medication refill. He states his pain is located in his neck radiating into his right shoulder and lower back pain. He rates his pain 7. His current exercise regime is walking, attending [employer] three days a week  and performing stretching exercises.  Erik Taylor Morphine equivalent is 90.00 MME.  Last UDS was Performed on 04/29/2018, it was consistent.    Pain Inventory Average Pain 7 Pain Right Now 7 My pain is sharp, burning, dull, stabbing, tingling and aching  In the last 24 hours, has pain interfered with the following? General activity 7 Relation with others 4 Enjoyment of life 7 What TIME of day is your pain at its worst? daytime Sleep (in general) Fair  Pain is worse with: walking, bending, sitting, inactivity, standing and some activites Pain improves with: rest, heat/ice and medication Relief from Meds: 5  Mobility walk without assistance ability to climb steps?  yes do you drive?  yes transfers alone Do you have any goals in this area?  no  Function disabled: date disabled . I need assistance with the following:  dressing, meal prep, household duties and shopping Do you have any goals in this area?  no  Neuro/Psych weakness numbness spasms  Prior Studies Any changes since last visit?  no  Physicians involved in your care Any changes since last visit?  no   History reviewed. No pertinent family history. Social History   Socioeconomic History  . Marital status: Married    Spouse name: Not on file  . Number of children: Not on file  . Years of education: Not on file  . Highest education level: Not on file  Occupational History  . Not on file  Social Needs  . Financial resource strain: Not on file  . Food insecurity:    Worry: Not on file   Inability: Not on file  . Transportation needs:    Medical: Not on file    Non-medical: Not on file  Tobacco Use  . Smoking status: Former Smoker    Types: Cigarettes    Last attempt to quit: 02/01/1994    Years since quitting: 24.5  . Smokeless tobacco: Never Used  Substance and Sexual Activity  . Alcohol use: Not on file  . Drug use: Not on file  . Sexual activity: Not on file  Lifestyle  . Physical activity:    Days per week: Not on file    Minutes per session: Not on file  . Stress: Not on file  Relationships  . Social connections:    Talks on phone: Not on file    Gets together: Not on file    Attends religious service: Not on file    Active member of club or organization: Not on file    Attends meetings of clubs or organizations: Not on file    Relationship status: Not on file  Other Topics Concern  . Not on file  Social History Narrative  . Not on file   History reviewed. No pertinent surgical history. History reviewed. No pertinent past medical history. BP 134/83 (BP Location: Right Arm, Patient Position: Sitting, Cuff Size: Normal)   Pulse 69   Resp 14   Ht 5\' 10"  (1.778 m)   Wt 205 lb (93 kg)  SpO2 98%   BMI 29.41 kg/m   Opioid Risk Score:   Fall Risk Score:  `1  Depression screen PHQ 2/9  Depression screen [facility] 2/9 05/30/2018 03/30/2018 02/01/2018  Decreased Interest 0 0 2  Down, Depressed, Hopeless 0 0 1  PHQ - 2 Score 0 0 3  Altered sleeping - - 2  Tired, decreased energy - - 2  Change in appetite - - 0  Feeling bad or failure about yourself  - - 2  Trouble concentrating - - 2  Moving slowly or fidgety/restless - - 0  Suicidal thoughts - - 0  PHQ-9 Score - - 11  Difficult doing work/chores - - Very difficult    Review of Systems  Constitutional: Negative.   HENT: Negative.   Eyes: Negative.   Respiratory: Negative.   Cardiovascular: Negative.   Gastrointestinal: Negative.   Endocrine: Negative.   Genitourinary: Negative.     Musculoskeletal: Positive for arthralgias, back pain and neck pain.       Spasms   Skin: Negative.   Allergic/Immunologic: Negative.   Neurological: Positive for weakness and numbness.  Hematological: Negative.   Psychiatric/Behavioral: Negative.   All other systems reviewed and are negative.      Objective:   Physical Exam Vitals signs and nursing note reviewed.  Constitutional:      Appearance: Normal appearance.  Neck:     Musculoskeletal: Normal range of motion and neck supple.     Comments: Cervical Paraspinal Tenderness: C-5-C-6  Cardiovascular:     Rate and Rhythm: Normal rate and regular rhythm.     Pulses: Normal pulses.     Heart sounds: Normal heart sounds.  Pulmonary:     Effort: Pulmonary effort is normal.     Breath sounds: Normal breath sounds.  Musculoskeletal:     Comments: Normal Muscle Bulk and Muscle Testing Reveals:  Upper Extremities: Full ROM and Muscle Strength on Right 4/5 and Left 5/5 Right AC Joint Tenderness Lumbar Hypersensitivity  Lower Extremities: Full ROM and Muscle Strength 5/5 Arises from chair with ease Narrow Based Gait   Skin:    General: Skin is warm and dry.  Neurological:     Mental Status: He is alert and oriented to person, place, and time.  Psychiatric:        Mood and Affect: Mood normal.        Behavior: Behavior normal.           Assessment & Plan:  1.Cervicalgia/  Cervical Spondylosis with Radiculopathy: Continue HEP as Tolerated and Topamax:08/22/2018. 2. Lumbar Spondylosis with Radiculopathy. Continue HEP as Tolerated andTopamax: Erik Taylor reports Gabapentin and Lyrica ineffective.08/22/2018 3. Injury of right shoulder and right upper arm. Continue HEP as Tolerated. Continue to Monitor.08/22/2018. 4. Chronic Pain Syndrome: Refilled: Oxymorphone 10 mg one tablet every 8 hours as needed for pain #90.  We will continue the opioid monitoring program, this consists of regular clinic visits, examinations,  urine drug screen, pill counts as well as use of [place] Controlled Substance Reporting system.  20 minutes of face to face patient care time was spent during this visit. All questions were encouraged and answered.   F/U in 1 month.

## 2018-08-23 ENCOUNTER — Ambulatory Visit: Payer: Medicare HMO | Admitting: Physical Medicine & Rehabilitation

## 2018-09-15 ENCOUNTER — Encounter: Payer: Self-pay | Admitting: Registered Nurse

## 2018-09-15 ENCOUNTER — Encounter: Payer: Medicare HMO | Attending: Physical Medicine & Rehabilitation | Admitting: Registered Nurse

## 2018-09-15 ENCOUNTER — Other Ambulatory Visit: Payer: Self-pay

## 2018-09-15 VITALS — BP 137/84 | HR 77 | Ht 70.0 in | Wt 207.0 lb

## 2018-09-15 DIAGNOSIS — M47812 Spondylosis without myelopathy or radiculopathy, cervical region: Secondary | ICD-10-CM | POA: Insufficient documentation

## 2018-09-15 DIAGNOSIS — Z5181 Encounter for therapeutic drug level monitoring: Secondary | ICD-10-CM

## 2018-09-15 DIAGNOSIS — G894 Chronic pain syndrome: Secondary | ICD-10-CM | POA: Diagnosis present

## 2018-09-15 DIAGNOSIS — M1711 Unilateral primary osteoarthritis, right knee: Secondary | ICD-10-CM | POA: Insufficient documentation

## 2018-09-15 DIAGNOSIS — Z87891 Personal history of nicotine dependence: Secondary | ICD-10-CM | POA: Diagnosis not present

## 2018-09-15 DIAGNOSIS — Z79891 Long term (current) use of opiate analgesic: Secondary | ICD-10-CM

## 2018-09-15 DIAGNOSIS — M5136 Other intervertebral disc degeneration, lumbar region: Secondary | ICD-10-CM | POA: Insufficient documentation

## 2018-09-15 DIAGNOSIS — M4726 Other spondylosis with radiculopathy, lumbar region: Secondary | ICD-10-CM | POA: Diagnosis not present

## 2018-09-15 DIAGNOSIS — S4491XS Injury of unspecified nerve at shoulder and upper arm level, right arm, sequela: Secondary | ICD-10-CM

## 2018-09-15 DIAGNOSIS — M4722 Other spondylosis with radiculopathy, cervical region: Secondary | ICD-10-CM | POA: Diagnosis not present

## 2018-09-15 MED ORDER — OXYMORPHONE HCL 10 MG PO TABS: 10.0000 mg | ORAL_TABLET | Freq: Three times a day (TID) | ORAL | 0 refills | Status: DC | PRN

## 2018-09-15 NOTE — Progress Notes (Signed)
Subjective:    Patient ID: Erik Taylor, male    DOB: April 18, 1956, 63 y.o.   MRN: [id number]  HPI: Erik Taylor is a 63 y.o. male who returns for follow up appointment for chronic pain and medication refill. He states his  pain is located in his neck radiating into his right shoulder and lower back pain. He rates his pain 7. His  current exercise regime is walking and performing stretching exercises.  [name] Morphine equivalent is 90.00  MME.  Last UDS was Performed on 04/29/2018, it was consistent.   Pain Inventory Average Pain 6 Pain Right Now 7 My pain is sharp, burning, dull, stabbing, tingling and aching  In the last 24 hours, has pain interfered with the following? General activity 7 Relation with others 4 Enjoyment of life 7 What TIME of day is your pain at its worst? daytime Sleep (in general) Fair  Pain is worse with: walking, bending, sitting, inactivity, standing and some activites Pain improves with: rest, heat/ice and medication Relief from Meds: 6  Mobility walk without assistance ability to climb steps?  yes do you drive?  yes  Function disabled: date disabled 57 I need assistance with the following:  dressing, meal prep, household duties and shopping  Neuro/Psych weakness numbness spasms  Prior Studies Any changes since last visit?  no  Physicians involved in your care Any changes since last visit?  no   No family history on file. Social History   Socioeconomic History  . Marital status: Married    Spouse name: Not on file  . Number of children: Not on file  . Years of education: Not on file  . Highest education level: Not on file  Occupational History  . Not on file  Social Needs  . Financial resource strain: Not on file  . Food insecurity:    Worry: Not on file    Inability: Not on file  . Transportation needs:    Medical: Not on file    Non-medical: Not on file  Tobacco Use  . Smoking status: Former Smoker    Types:  Cigarettes    Last attempt to quit: 02/01/1994    Years since quitting: 24.6  . Smokeless tobacco: Never Used  Substance and Sexual Activity  . Alcohol use: Not on file  . Drug use: Not on file  . Sexual activity: Not on file  Lifestyle  . Physical activity:    Taylor per week: Not on file    Minutes per session: Not on file  . Stress: Not on file  Relationships  . Social connections:    Talks on phone: Not on file    Gets together: Not on file    Attends religious service: Not on file    Active member of club or organization: Not on file    Attends meetings of clubs or organizations: Not on file    Relationship status: Not on file  Other Topics Concern  . Not on file  Social History Narrative  . Not on file   No past surgical history on file. No past medical history on file. BP 137/84   Pulse 77   Ht 5\' 10"  (1.778 m)   Wt 207 lb (93.9 kg)   SpO2 98%   BMI 29.70 kg/m   Opioid Risk Score:   Fall Risk Score:  `1  Depression screen PHQ 2/9  Depression screen [facility] 2/9 05/30/2018 03/30/2018 02/01/2018  Decreased Interest 0 0 2  Down,  Depressed, Hopeless 0 0 1  PHQ - 2 Score 0 0 3  Altered sleeping - - 2  Tired, decreased energy - - 2  Change in appetite - - 0  Feeling bad or failure about yourself  - - 2  Trouble concentrating - - 2  Moving slowly or fidgety/restless - - 0  Suicidal thoughts - - 0  PHQ-9 Score - - 11  Difficult doing work/chores - - Very difficult     Review of Systems  Constitutional: Negative.   HENT: Negative.   Eyes: Negative.   Respiratory: Negative.   Cardiovascular: Negative.   Gastrointestinal: Negative.   Endocrine: Negative.   Genitourinary: Negative.   Musculoskeletal: Positive for arthralgias, back pain and myalgias.  Skin: Negative.   Allergic/Immunologic: Negative.   Neurological: Positive for weakness and numbness.  Hematological: Negative.   Psychiatric/Behavioral: Negative.   All other systems reviewed and are negative.       Objective:   Physical Exam Vitals signs and nursing note reviewed.  Constitutional:      Appearance: Normal appearance.  Neck:     Musculoskeletal: Normal range of motion and neck supple.     Comments: Cervical Paraspinal Tenderness: C-5-C-6 Cardiovascular:     Rate and Rhythm: Normal rate and regular rhythm.     Pulses: Normal pulses.     Heart sounds: Normal heart sounds.  Pulmonary:     Effort: Pulmonary effort is normal.     Breath sounds: Normal breath sounds.  Musculoskeletal:     Comments: Normal Muscle Bulk and Muscle Testing Reveals:  Upper Extremities: Right: Decreased ROM 90 Degrees  and Muscle Strength 4/5  Left: Full ROM and Muscle Strength 5/5 Right AC Joint Tenderness Lumbar Paraspinal Tenderness: L-4-L-5 Lower Extremities: Full ROM and Muscle Strength 5/5 Arises from chair with ease Narrow Based  Gait   Skin:    General: Skin is warm and dry.  Neurological:     Mental Status: He is alert and oriented to person, place, and time.  Psychiatric:        Mood and Affect: Mood normal.        Behavior: Behavior normal.           Assessment & Plan:  1.Cervicalgia/  Cervical Spondylosis with Radiculopathy: Continue HEP as Tolerated andTopamax:09/15/2018. 2. Lumbar Spondylosis with Radiculopathy. Continue HEP as Tolerated andTopamax: [name] reports Gabapentin and Lyrica ineffective.09/15/2018 3. Injury of right shoulder and right upper arm. Continue HEP as Tolerated. Continue to Monitor.09/15/2018. 4. Chronic Pain Syndrome: Refilled: Oxymorphone10 mg one tablet every 8 hours as needed for pain #90.  We will continue the opioid monitoring program, this consists of regular clinic visits, examinations, urine drug screen, pill counts as well as use of [place] Controlled Substance Reporting system.  20 minutes of face to face patient care time was spent during this visit. All questions were encouraged and answered.   F/U in 1 month.

## 2018-09-19 ENCOUNTER — Telehealth: Payer: Self-pay | Admitting: Registered Nurse

## 2018-09-19 ENCOUNTER — Ambulatory Visit: Payer: Medicare HMO | Admitting: Registered Nurse

## 2018-09-19 DIAGNOSIS — M4726 Other spondylosis with radiculopathy, lumbar region: Secondary | ICD-10-CM

## 2018-09-19 DIAGNOSIS — S4491XS Injury of unspecified nerve at shoulder and upper arm level, right arm, sequela: Secondary | ICD-10-CM

## 2018-09-19 DIAGNOSIS — M4722 Other spondylosis with radiculopathy, cervical region: Secondary | ICD-10-CM

## 2018-09-19 MED ORDER — OXYMORPHONE HCL 10 MG PO TABS: 10.0000 mg | ORAL_TABLET | Freq: Three times a day (TID) | ORAL | 0 refills | Status: DC | PRN

## 2018-09-19 NOTE — Telephone Encounter (Signed)
Mr. Kimery called stating the [facility] [address] doesn't [facility] his Opana, spoke with pharmacist. Prescription removed from his profile , [facility]. Mr. Rahl is aware of the above.

## 2018-09-22 LAB — TOXASSURE SELECT,+ANTIDEPR,UR

## 2018-09-26 ENCOUNTER — Telehealth: Payer: Self-pay | Admitting: *Deleted

## 2018-09-26 NOTE — Telephone Encounter (Signed)
Urine drug screen for this encounter is consistent for prescribed medication 

## 2018-10-13 ENCOUNTER — Ambulatory Visit: Payer: Medicare HMO | Admitting: Registered Nurse

## 2018-10-17 ENCOUNTER — Encounter: Payer: Medicare HMO | Attending: Physical Medicine & Rehabilitation | Admitting: Registered Nurse

## 2018-10-17 ENCOUNTER — Other Ambulatory Visit: Payer: Self-pay

## 2018-10-17 ENCOUNTER — Encounter: Payer: Self-pay | Admitting: Registered Nurse

## 2018-10-17 VITALS — BP 112/96 | HR 90 | Temp 98.1°F | Ht 70.0 in | Wt 194.0 lb

## 2018-10-17 DIAGNOSIS — M4722 Other spondylosis with radiculopathy, cervical region: Secondary | ICD-10-CM | POA: Diagnosis not present

## 2018-10-17 DIAGNOSIS — M1711 Unilateral primary osteoarthritis, right knee: Secondary | ICD-10-CM | POA: Insufficient documentation

## 2018-10-17 DIAGNOSIS — G894 Chronic pain syndrome: Secondary | ICD-10-CM | POA: Insufficient documentation

## 2018-10-17 DIAGNOSIS — M542 Cervicalgia: Secondary | ICD-10-CM | POA: Diagnosis not present

## 2018-10-17 DIAGNOSIS — S4491XS Injury of unspecified nerve at shoulder and upper arm level, right arm, sequela: Secondary | ICD-10-CM | POA: Diagnosis not present

## 2018-10-17 DIAGNOSIS — M47812 Spondylosis without myelopathy or radiculopathy, cervical region: Secondary | ICD-10-CM | POA: Insufficient documentation

## 2018-10-17 DIAGNOSIS — Z5181 Encounter for therapeutic drug level monitoring: Secondary | ICD-10-CM

## 2018-10-17 DIAGNOSIS — M4726 Other spondylosis with radiculopathy, lumbar region: Secondary | ICD-10-CM

## 2018-10-17 DIAGNOSIS — M545 Low back pain, unspecified: Secondary | ICD-10-CM

## 2018-10-17 DIAGNOSIS — Z87891 Personal history of nicotine dependence: Secondary | ICD-10-CM | POA: Insufficient documentation

## 2018-10-17 DIAGNOSIS — Z79891 Long term (current) use of opiate analgesic: Secondary | ICD-10-CM

## 2018-10-17 DIAGNOSIS — M5136 Other intervertebral disc degeneration, lumbar region: Secondary | ICD-10-CM | POA: Insufficient documentation

## 2018-10-17 DIAGNOSIS — G8929 Other chronic pain: Secondary | ICD-10-CM

## 2018-10-17 MED ORDER — OXYMORPHONE HCL 10 MG PO TABS: 10.0000 mg | ORAL_TABLET | Freq: Three times a day (TID) | ORAL | 0 refills | Status: DC | PRN

## 2018-10-17 NOTE — Progress Notes (Signed)
Subjective:    Patient ID: Erik Taylor, male    DOB: 05/08/1956, 63 y.o.   MRN: [id number]  HPI: Erik Taylor is a 63 y.o. male his appointment was changed, due to national recommendations of social distancing due to COVID 19, an audio/video telehealth visit is felt to be most appropriate for this patient at this time.  See Chart message from today for the patient's consent to telehealth from [facility].   He   states his pain is located in his neck, right arm and lower back. He rates his pain 8. His current exercise regime is walking and performing stretching exercises.  [name] equivalent is 90.00MME.  Last UDS was Performed on 09/15/2018, it was consistent.   Erik Taylor CMA asked the Health and History Questions. This provider and Erik Taylor verified we were speaking with the correct person using two identifiers.  Pain Inventory Average Pain 6 Pain Right Now 8 My pain is constant, sharp, burning, dull, stabbing, tingling and aching  In the last 24 hours, has pain interfered with the following? General activity 6 Relation with others 0 Enjoyment of life 6 What TIME of day is your pain at its worst? varies Sleep (in general) Fair  Pain is worse with: walking, standing and some activites Pain improves with: medication Relief from Meds: 6  Mobility walk without assistance how many minutes can you walk? ? ability to climb steps?  yes do you drive?  yes  Function disabled: date disabled .  Neuro/Psych numbness tingling trouble walking spasms  Prior Studies Any changes since last visit?  no  Physicians involved in your care Any changes since last visit?  no   History reviewed. No pertinent family history. Social History   Socioeconomic History  . Marital status: Married    Spouse name: Not on file  . Number of children: Not on file  . Years of education: Not on file  . Highest education level:  Not on file  Occupational History  . Not on file  Social Needs  . Financial resource strain: Not on file  . Food insecurity:    Worry: Not on file    Inability: Not on file  . Transportation needs:    Medical: Not on file    Non-medical: Not on file  Tobacco Use  . Smoking status: Former Smoker    Types: Cigarettes    Last attempt to quit: 02/01/1994    Years since quitting: 24.7  . Smokeless tobacco: Never Used  Substance and Sexual Activity  . Alcohol use: Not on file  . Drug use: Not on file  . Sexual activity: Not on file  Lifestyle  . Physical activity:    Days per week: Not on file    Minutes per session: Not on file  . Stress: Not on file  Relationships  . Social connections:    Talks on phone: Not on file    Gets together: Not on file    Attends religious service: Not on file    Active member of club or organization: Not on file    Attends meetings of clubs or organizations: Not on file    Relationship status: Not on file  Other Topics Concern  . Not on file  Social History Narrative  . Not on file   History reviewed. No pertinent surgical history. History reviewed. No pertinent past medical history. Ht 5\' 10"  (1.778 m)   Wt 207 lb (  93.9 kg)   BMI 29.70 kg/m   Opioid Risk Score:   Fall Risk Score:  `1  Depression screen PHQ 2/9  Depression screen [facility] 2/9 05/30/2018 03/30/2018 02/01/2018  Decreased Interest 0 0 2  Down, Depressed, Hopeless 0 0 1  PHQ - 2 Score 0 0 3  Altered sleeping - - 2  Tired, decreased energy - - 2  Change in appetite - - 0  Feeling bad or failure about yourself  - - 2  Trouble concentrating - - 2  Moving slowly or fidgety/restless - - 0  Suicidal thoughts - - 0  PHQ-9 Score - - 11  Difficult doing work/chores - - Very difficult    Review of Systems  Constitutional: Negative.   HENT: Negative.   Eyes: Negative.   Respiratory: Negative.   Cardiovascular: Negative.   Gastrointestinal: Negative.   Endocrine: Negative.    Genitourinary: Negative.   Musculoskeletal: Positive for arthralgias, back pain, neck pain and neck stiffness.       Spasms  Skin: Negative.   Allergic/Immunologic: Negative.   Neurological: Positive for numbness.       Tingling  Hematological: Negative.   All other systems reviewed and are negative.      Objective:   Physical Exam        Assessment & Plan:  1.Cervicalgia/Cervical Spondylosis with Radiculopathy: Continue HEP as Tolerated andTopamax:10/17/2018. 2. Lumbar Spondylosis with Radiculopathy. Continue HEP as Tolerated andTopamax: Erik Taylor reports Gabapentin and Lyrica ineffective.10/17/2018 3. Injury of right shoulder and right upper arm. Continue HEP as Tolerated. Continue to Monitor.10/17/2018. 4. Chronic Pain Syndrome: Refilled: Oxymorphone10 mg one tablet every 8 hours as needed for pain #90.  We will continue the opioid monitoring program, this consists of regular clinic visits, examinations, urine drug screen, pill counts as well as use of [place] Controlled Substance Reporting system.  F/U in 1 month.  Telephone Call  Location of patient: In his Home Location of provider: Office Established patient Time spent on call: 15 minutes

## 2018-11-09 ENCOUNTER — Encounter: Payer: Self-pay | Admitting: Registered Nurse

## 2018-11-09 ENCOUNTER — Encounter: Payer: Medicare HMO | Attending: Physical Medicine & Rehabilitation | Admitting: Registered Nurse

## 2018-11-09 ENCOUNTER — Other Ambulatory Visit: Payer: Self-pay

## 2018-11-09 VITALS — BP 117/75 | HR 88 | Temp 97.6°F | Ht 70.0 in | Wt 192.0 lb

## 2018-11-09 DIAGNOSIS — Z79891 Long term (current) use of opiate analgesic: Secondary | ICD-10-CM

## 2018-11-09 DIAGNOSIS — M545 Low back pain, unspecified: Secondary | ICD-10-CM

## 2018-11-09 DIAGNOSIS — M5136 Other intervertebral disc degeneration, lumbar region: Secondary | ICD-10-CM | POA: Insufficient documentation

## 2018-11-09 DIAGNOSIS — M4722 Other spondylosis with radiculopathy, cervical region: Secondary | ICD-10-CM

## 2018-11-09 DIAGNOSIS — S4491XS Injury of unspecified nerve at shoulder and upper arm level, right arm, sequela: Secondary | ICD-10-CM | POA: Diagnosis not present

## 2018-11-09 DIAGNOSIS — G8929 Other chronic pain: Secondary | ICD-10-CM

## 2018-11-09 DIAGNOSIS — M4726 Other spondylosis with radiculopathy, lumbar region: Secondary | ICD-10-CM

## 2018-11-09 DIAGNOSIS — M47812 Spondylosis without myelopathy or radiculopathy, cervical region: Secondary | ICD-10-CM | POA: Insufficient documentation

## 2018-11-09 DIAGNOSIS — M542 Cervicalgia: Secondary | ICD-10-CM | POA: Diagnosis not present

## 2018-11-09 DIAGNOSIS — M1711 Unilateral primary osteoarthritis, right knee: Secondary | ICD-10-CM | POA: Insufficient documentation

## 2018-11-09 DIAGNOSIS — Z87891 Personal history of nicotine dependence: Secondary | ICD-10-CM | POA: Insufficient documentation

## 2018-11-09 DIAGNOSIS — Z5181 Encounter for therapeutic drug level monitoring: Secondary | ICD-10-CM

## 2018-11-09 DIAGNOSIS — G894 Chronic pain syndrome: Secondary | ICD-10-CM | POA: Insufficient documentation

## 2018-11-09 MED ORDER — OXYMORPHONE HCL 10 MG PO TABS: 10.0000 mg | ORAL_TABLET | Freq: Three times a day (TID) | ORAL | 0 refills | Status: DC | PRN

## 2018-11-09 NOTE — Progress Notes (Signed)
Subjective:    Patient ID: Erik Taylor, male    DOB: 12/07/1955, 63 y.o.   MRN: [id number]  HPI: Erik Taylor is a 63 y.o. male his appointment was changed, due to national recommendations of social distancing due to COVID 19, an audio/video telehealth visit is felt to be most appropriate for this patient at this time.  See Chart message from today for the patient's consent to telehealth from [facility].     He states his pain is located in his neck, right arm and upper- lower back. He rates his pain 7. His  current exercise regime is walking.  [name] Morphine equivalent is 90.00  MME.  Last UDS was Performed on 09/15/2018, it was consistent.   [name] CMA asked the Taylor and History Questions. This provider and [name] verified we were speaking with the correct person using two identifiers.  Pain Inventory Average Pain 7 Pain Right Now 7 My pain is constant, sharp, burning, dull, stabbing, tingling and aching  In the last 24 hours, has pain interfered with the following? General activity 8 Relation with others 5 Enjoyment of life 8 What TIME of day is your pain at its worst? all Sleep (in general) Fair  Pain is worse with: walking, bending, standing and some activites Pain improves with: rest, heat/ice and medication Relief from Meds: 5  Mobility walk without assistance how many minutes can you walk? varies ability to climb steps?  yes do you drive?  yes  Function disabled: date disabled .  Neuro/Psych weakness numbness tingling trouble walking spasms depression anxiety  Prior Studies Any changes since last visit?  no  Physicians involved in your care Any changes since last visit?  no   History reviewed. No pertinent family history. Social History   Socioeconomic History  . Marital status: Married    Spouse name: Not on file  . Number of children: Not on file  . Years of education: Not on  file  . Highest education level: Not on file  Occupational History  . Not on file  Social Needs  . Financial resource strain: Not on file  . Food insecurity:    Worry: Not on file    Inability: Not on file  . Transportation needs:    Medical: Not on file    Non-medical: Not on file  Tobacco Use  . Smoking status: Former Smoker    Types: Cigarettes    Last attempt to quit: 02/01/1994    Years since quitting: 24.7  . Smokeless tobacco: Never Used  Substance and Sexual Activity  . Alcohol use: Not on file  . Drug use: Not on file  . Sexual activity: Not on file  Lifestyle  . Physical activity:    Days per week: Not on file    Minutes per session: Not on file  . Stress: Not on file  Relationships  . Social connections:    Talks on phone: Not on file    Gets together: Not on file    Attends religious service: Not on file    Active member of club or organization: Not on file    Attends meetings of clubs or organizations: Not on file    Relationship status: Not on file  Other Topics Concern  . Not on file  Social History Narrative  . Not on file   History reviewed. No pertinent surgical history. History reviewed. No pertinent past medical history. BP 117/75  Pulse 88   Temp 97.6 F (36.4 C)   Ht 5\' 10"  (1.778 m)   Wt 192 lb (87.1 kg)   BMI 27.55 kg/m   Opioid Risk Score:   Fall Risk Score:  `1  Depression screen PHQ 2/9  Depression screen [facility] 2/9 05/30/2018 03/30/2018 02/01/2018  Decreased Interest 0 0 2  Down, Depressed, Hopeless 0 0 1  PHQ - 2 Score 0 0 3  Altered sleeping - - 2  Tired, decreased energy - - 2  Change in appetite - - 0  Feeling bad or failure about yourself  - - 2  Trouble concentrating - - 2  Moving slowly or fidgety/restless - - 0  Suicidal thoughts - - 0  PHQ-9 Score - - 11  Difficult doing work/chores - - Very difficult    Review of Systems  Constitutional: Negative.   HENT: Negative.   Eyes: Negative.   Respiratory: Negative.    Gastrointestinal: Negative.   Endocrine: Negative.   Genitourinary: Negative.   Musculoskeletal: Positive for arthralgias, back pain, neck pain and neck stiffness.       Spasms   Skin: Negative.   Neurological: Positive for weakness and numbness.       Tingling   Psychiatric/Behavioral: Positive for dysphoric mood. The patient is nervous/anxious.   All other systems reviewed and are negative.      Objective:   Physical Exam Vitals signs and nursing note reviewed.  Musculoskeletal:     Comments: No Physical Exam Perform: Virtual Visit  Neurological:     Mental Status: He is oriented to person, place, and time.           Assessment & Plan:  1.Cervicalgia/Cervical Spondylosis with Radiculopathy: Continue HEP as Tolerated andTopamax:11/09/2018. 2. Lumbar Spondylosis with Radiculopathy. Continue HEP as Tolerated andTopamax: [name] reports Gabapentin and Lyrica ineffective.11/09/2018 3. Injury of right shoulder and right upper arm. Continue HEP as Tolerated. Continue to Monitor.11/09/2018. 4. Chronic Pain Syndrome: Refilled: Oxymorphone10 mg one tablet every 8 hours as needed for pain #90.  We will continue the opioid monitoring program, this consists of regular clinic visits, examinations, urine drug screen, pill counts as well as use of [place] Controlled Substance Reporting system.  F/U in 1 month.  Telephone Call  Location of patient: In his Home Location of provider: Office Established patient Time Spent on Call: 10 Minutes

## 2018-11-10 ENCOUNTER — Telehealth: Payer: Self-pay

## 2018-11-10 NOTE — Telephone Encounter (Signed)
Recieved faxed letter from patients pharmacy in regards to this patient.  On the letter it stated:  Patient requests new RX: Consider sending your patients Rx's to the pharmacy he will be at when it is due.  More appropriate then overriding and dispensing the supply early.Marland Kitchenas he is asking  Called pharmacy for more information on this matter and they stated that it was sent yesterday to [place] and were uncertain on the circumstances on what prompted the leter to begin with as that staff were not there today.  Called Patient on both home and mobile numbers that are listed.  No answer.  Left voicemail to return call to help clear this letter up.

## 2018-11-11 NOTE — Telephone Encounter (Signed)
Spoke with patient on phone today, stated that he is going to be out of town during time of refill and asked if it was possible to receive an early refill of the medication as he would be out of town at that time.  Informed him that they can not do that now do the opioid crisis and it would be simpler for him to either locate a pharmacy at his destination, or a family member can pick it up at his usual pharmacy and take it to him.  He elected to have his son pick it up for him at his usual location.

## 2018-11-15 ENCOUNTER — Ambulatory Visit: Payer: Medicare HMO | Admitting: Physical Medicine & Rehabilitation

## 2018-11-16 ENCOUNTER — Ambulatory Visit: Payer: Medicare HMO | Admitting: Registered Nurse

## 2018-11-22 ENCOUNTER — Ambulatory Visit: Payer: Medicare HMO | Admitting: Physical Medicine & Rehabilitation

## 2018-12-14 ENCOUNTER — Ambulatory Visit: Payer: Medicare HMO | Admitting: Physical Medicine & Rehabilitation

## 2018-12-14 ENCOUNTER — Encounter: Payer: Self-pay | Admitting: Registered Nurse

## 2018-12-14 ENCOUNTER — Other Ambulatory Visit: Payer: Self-pay

## 2018-12-14 ENCOUNTER — Encounter: Payer: Medicare HMO | Attending: Physical Medicine & Rehabilitation | Admitting: Registered Nurse

## 2018-12-14 VITALS — BP 137/90 | Temp 95.4°F | Ht 70.0 in | Wt 193.0 lb

## 2018-12-14 DIAGNOSIS — Z79891 Long term (current) use of opiate analgesic: Secondary | ICD-10-CM

## 2018-12-14 DIAGNOSIS — Z5181 Encounter for therapeutic drug level monitoring: Secondary | ICD-10-CM

## 2018-12-14 DIAGNOSIS — M1711 Unilateral primary osteoarthritis, right knee: Secondary | ICD-10-CM | POA: Insufficient documentation

## 2018-12-14 DIAGNOSIS — M542 Cervicalgia: Secondary | ICD-10-CM

## 2018-12-14 DIAGNOSIS — S4491XS Injury of unspecified nerve at shoulder and upper arm level, right arm, sequela: Secondary | ICD-10-CM

## 2018-12-14 DIAGNOSIS — M47812 Spondylosis without myelopathy or radiculopathy, cervical region: Secondary | ICD-10-CM | POA: Insufficient documentation

## 2018-12-14 DIAGNOSIS — M5412 Radiculopathy, cervical region: Secondary | ICD-10-CM

## 2018-12-14 DIAGNOSIS — M5136 Other intervertebral disc degeneration, lumbar region: Secondary | ICD-10-CM | POA: Insufficient documentation

## 2018-12-14 DIAGNOSIS — G894 Chronic pain syndrome: Secondary | ICD-10-CM

## 2018-12-14 DIAGNOSIS — Z87891 Personal history of nicotine dependence: Secondary | ICD-10-CM | POA: Insufficient documentation

## 2018-12-14 DIAGNOSIS — M4722 Other spondylosis with radiculopathy, cervical region: Secondary | ICD-10-CM

## 2018-12-14 DIAGNOSIS — M4726 Other spondylosis with radiculopathy, lumbar region: Secondary | ICD-10-CM

## 2018-12-14 MED ORDER — OXYMORPHONE HCL 10 MG PO TABS: 10.0000 mg | ORAL_TABLET | Freq: Three times a day (TID) | ORAL | 0 refills | Status: DC | PRN

## 2018-12-14 NOTE — Progress Notes (Signed)
Subjective:    Patient ID: Erik Taylor, male    DOB: 06/12/1956, 63 y.o.   MRN: [id number]  HPI: Erik Taylor is a 63 y.o. male  his appointment was changed to a phone call, [name] stated he can't wear a mask due to being claustrophobic, office staff explained [facility] Policy, he verbalizes understanding. His appointment was changed to phone call, this provider explained the office policy and he is aware he will have to wear a mask at his future appointments until policy changes, he verbalizes understanding. He states his pain is located in his neck radiating into his right shoulder and right arm and lower back pain. He rates his pain 7.His  current exercise regime is walking and performing stretching exercises.  [name] Morphine equivalent is90.00 MME.  Last UDS was Performed on 09/15/2018, it was consistent.   Pain Inventory Average Pain 7 Pain Right Now 7 My pain is sharp, burning, dull, stabbing, tingling and aching  In the last 24 hours, has pain interfered with the following? General activity 7 Relation with others 4 Enjoyment of life 7 What TIME of day is your pain at its worst? daytime Sleep (in general) Fair  Pain is worse with: walking, bending, sitting, inactivity, standing and some activites Pain improves with: rest, heat/ice and medication Relief from Meds: no selection  Mobility walk without assistance ability to climb steps?  yes do you drive?  yes Do you have any goals in this area?  no  Function disabled: date disabled . I need assistance with the following:  dressing, meal prep, household duties and shopping Do you have any goals in this area?  no  Neuro/Psych weakness numbness spasms  Prior Studies Any changes since last visit?  no  Physicians involved in your care Any changes since last visit?  no   History reviewed. No pertinent family history. Social History   Socioeconomic History  . Marital status: Married    Spouse name: Not  on file  . Number of children: Not on file  . Years of education: Not on file  . Highest education level: Not on file  Occupational History  . Not on file  Social Needs  . Financial resource strain: Not on file  . Food insecurity:    Worry: Not on file    Inability: Not on file  . Transportation needs:    Medical: Not on file    Non-medical: Not on file  Tobacco Use  . Smoking status: Former Smoker    Types: Cigarettes    Last attempt to quit: 02/01/1994    Years since quitting: 24.8  . Smokeless tobacco: Never Used  Substance and Sexual Activity  . Alcohol use: Not on file  . Drug use: Not on file  . Sexual activity: Not on file  Lifestyle  . Physical activity:    Days per week: Not on file    Minutes per session: Not on file  . Stress: Not on file  Relationships  . Social connections:    Talks on phone: Not on file    Gets together: Not on file    Attends religious service: Not on file    Active member of club or organization: Not on file    Attends meetings of clubs or organizations: Not on file    Relationship status: Not on file  Other Topics Concern  . Not on file  Social History Narrative  . Not on file   History reviewed.  No pertinent surgical history. History reviewed. No pertinent past medical history. There were no vitals taken for this visit.  Opioid Risk Score:   Fall Risk Score:  `1  Depression screen PHQ 2/9  Depression screen [facility] 2/9 05/30/2018 03/30/2018 02/01/2018  Decreased Interest 0 0 2  Down, Depressed, Hopeless 0 0 1  PHQ - 2 Score 0 0 3  Altered sleeping - - 2  Tired, decreased energy - - 2  Change in appetite - - 0  Feeling bad or failure about yourself  - - 2  Trouble concentrating - - 2  Moving slowly or fidgety/restless - - 0  Suicidal thoughts - - 0  PHQ-9 Score - - 11  Difficult doing work/chores - - Very difficult    Review of Systems  Constitutional: Negative.   HENT: Negative.   Eyes: Negative.   Respiratory:  Negative.   Cardiovascular: Negative.   Gastrointestinal: Negative.   Endocrine: Negative.   Genitourinary: Negative.   Musculoskeletal: Positive for back pain and neck pain.       Spasms   Allergic/Immunologic: Negative.   Neurological: Positive for weakness and numbness.  Hematological: Negative.   All other systems reviewed and are negative.      Objective:   Physical Exam Vitals signs and nursing note reviewed.  Musculoskeletal:     Comments: No Physical Exam Performed: Virtual Visit  Neurological:     Mental Status: He is oriented to person, place, and time.           Assessment & Plan:  1.Cervicalgia/Cervical Spondylosis with Radiculopathy: Continue HEP as Tolerated andTopamax:12/14/2018. 2. Lumbar Spondylosis with Radiculopathy. Continue HEP as Tolerated andTopamax: Erik Taylor reports Gabapentin and Lyrica ineffective.12/14/2018 3. Injury of right shoulder and right upper arm. Continue HEP as Tolerated. Continue to Monitor.12/14/2018. 4. Chronic Pain Syndrome: Refilled: Oxymorphone10 mg one tablet every 8 hours as needed for pain #90.  We will continue the opioid monitoring program, this consists of regular clinic visits, examinations, urine drug screen, pill counts as well as use of [place] Controlled Substance Reporting system.  F/U in 1 month.  Telephone Call Location of patient:In his car Location of provider: Office Established patient Time Spent on Call: 10 Minutes

## 2019-01-12 ENCOUNTER — Encounter: Payer: Medicare HMO | Attending: Physical Medicine & Rehabilitation | Admitting: Registered Nurse

## 2019-01-12 ENCOUNTER — Other Ambulatory Visit: Payer: Self-pay

## 2019-01-12 ENCOUNTER — Encounter: Payer: Self-pay | Admitting: Registered Nurse

## 2019-01-12 VITALS — BP 128/77 | HR 74 | Temp 97.7°F | Ht 70.0 in | Wt 200.0 lb

## 2019-01-12 DIAGNOSIS — M5412 Radiculopathy, cervical region: Secondary | ICD-10-CM | POA: Diagnosis not present

## 2019-01-12 DIAGNOSIS — G894 Chronic pain syndrome: Secondary | ICD-10-CM | POA: Diagnosis present

## 2019-01-12 DIAGNOSIS — M47812 Spondylosis without myelopathy or radiculopathy, cervical region: Secondary | ICD-10-CM | POA: Insufficient documentation

## 2019-01-12 DIAGNOSIS — S4491XS Injury of unspecified nerve at shoulder and upper arm level, right arm, sequela: Secondary | ICD-10-CM

## 2019-01-12 DIAGNOSIS — Z87891 Personal history of nicotine dependence: Secondary | ICD-10-CM | POA: Diagnosis not present

## 2019-01-12 DIAGNOSIS — M4722 Other spondylosis with radiculopathy, cervical region: Secondary | ICD-10-CM

## 2019-01-12 DIAGNOSIS — M5136 Other intervertebral disc degeneration, lumbar region: Secondary | ICD-10-CM | POA: Diagnosis not present

## 2019-01-12 DIAGNOSIS — M1711 Unilateral primary osteoarthritis, right knee: Secondary | ICD-10-CM | POA: Diagnosis not present

## 2019-01-12 DIAGNOSIS — M545 Low back pain: Secondary | ICD-10-CM | POA: Diagnosis not present

## 2019-01-12 DIAGNOSIS — M542 Cervicalgia: Secondary | ICD-10-CM

## 2019-01-12 DIAGNOSIS — G8929 Other chronic pain: Secondary | ICD-10-CM

## 2019-01-12 DIAGNOSIS — Z79891 Long term (current) use of opiate analgesic: Secondary | ICD-10-CM

## 2019-01-12 DIAGNOSIS — M4726 Other spondylosis with radiculopathy, lumbar region: Secondary | ICD-10-CM

## 2019-01-12 DIAGNOSIS — Z5181 Encounter for therapeutic drug level monitoring: Secondary | ICD-10-CM

## 2019-01-12 MED ORDER — OXYMORPHONE HCL 10 MG PO TABS: 10.0000 mg | ORAL_TABLET | Freq: Three times a day (TID) | ORAL | 0 refills | Status: DC | PRN

## 2019-01-12 NOTE — Progress Notes (Signed)
Subjective:    Patient ID: Erik Taylor, male    DOB: 08/29/1955, 63 y.o.   MRN: [id number]  HPI: Erik Taylor is a 63 y.o. male who returns for follow up appointment for chronic pain and medication refill. He states his pain is located in his neck radiating into his right shoulder and lower back pain. He rates his pain 6. His current exercise regime is walking and performing yard work.  Erik Taylor Morphine equivalent is 90.00 MME.  Last UDS was Performed on 09/15/2018, it was consistent.   Pain Inventory Average Pain 7 Pain Right Now 6 My pain is sharp, burning, dull, stabbing, tingling and aching  In the last 24 hours, has pain interfered with the following? General activity 7 Relation with others 5 Enjoyment of life 7 What TIME of day is your pain at its worst? daytime Sleep (in general) n/a  Pain is worse with: walking, bending, sitting, inactivity, standing and some activites Pain improves with: rest, heat/ice and medication Relief from Meds: 7  Mobility walk without assistance ability to climb steps?  yes do you drive?  yes  Function disabled: date disabled 171994 I need assistance with the following:  dressing, meal prep, household duties and shopping  Neuro/Psych weakness numbness spasms  Prior Studies n/a  Physicians involved in your care n/a   No family history on file. Social History   Socioeconomic History  . Marital status: Married    Spouse name: Not on file  . Number of children: Not on file  . Years of education: Not on file  . Highest education level: Not on file  Occupational History  . Not on file  Social Needs  . Financial resource strain: Not on file  . Food insecurity    Worry: Not on file    Inability: Not on file  . Transportation needs    Medical: Not on file    Non-medical: Not on file  Tobacco Use  . Smoking status: Former Smoker    Types: Cigarettes    Quit date: 02/01/1994    Years since quitting: 24.9  .  Smokeless tobacco: Never Used  Substance and Sexual Activity  . Alcohol use: Not on file  . Drug use: Not on file  . Sexual activity: Not on file  Lifestyle  . Physical activity    Days per week: Not on file    Minutes per session: Not on file  . Stress: Not on file  Relationships  . Social Musicianconnections    Talks on phone: Not on file    Gets together: Not on file    Attends religious service: Not on file    Active member of club or organization: Not on file    Attends meetings of clubs or organizations: Not on file    Relationship status: Not on file  Other Topics Concern  . Not on file  Social History Narrative  . Not on file   No past surgical history on file. No past medical history on file. There were no vitals taken for this visit.  Opioid Risk Score:   Fall Risk Score:  `1  Depression screen PHQ 2/9  [facility] 2/[facility] 05/30/2018 03/30/2018 02/01/2018  Decreased Interest 0 0 2  Down, Depressed, Hopeless 0 0 1  PHQ - 2 Score 0 0 3  Altered sleeping - - 2  Tired, decreased energy - - 2  Change in appetite - - 0  Feeling bad or failure about  yourself  - - 2  Trouble concentrating - - 2  Moving slowly or fidgety/restless - - 0  Suicidal thoughts - - 0  PHQ-9 Score - - 11  Difficult doing work/chores - - Very difficult     Review of Systems  All other systems reviewed and are negative.      Objective:   Physical Exam Vitals signs and nursing note reviewed.  Constitutional:      Appearance: Normal appearance.  Neck:     Musculoskeletal: Normal range of motion and neck supple.     Comments: Cervical Paraspinal Tenderness: C-5-C-6  Cardiovascular:     Rate and Rhythm: Normal rate and regular rhythm.     Pulses: Normal pulses.     Heart sounds: Normal heart sounds.  Pulmonary:     Effort: Pulmonary effort is normal.     Breath sounds: Normal breath sounds.  Musculoskeletal:     Comments: Normal Muscle Bulk and Muscle Testing Reveals:  Upper  Extremities:Full  ROM and Muscle Strength 5/5 Right AC Joint Tenderness  Lumbar Paraspinal Tenderness: L-4-L-5 Lower Extremities: Full ROM and Muscle Strength 5/5 Arises from Chair with ease Narrow Based Gait   Skin:    General: Skin is warm and dry.  Neurological:     Mental Status: He is alert and oriented to person, place, and time.  Psychiatric:        Mood and Affect: Mood normal.        Behavior: Behavior normal.           Assessment & Plan:  1.Cervicalgia/Cervical Spondylosis with Radiculopathy: Continue HEP as Tolerated andTopamax:01/12/2019. 2. Lumbar Spondylosis with Radiculopathy. Continue HEP as Tolerated andTopamax: Erik Taylor reports Gabapentin and Lyrica ineffective.01/12/2019 3. Injury of right shoulder and right upper arm. Continue HEP as Tolerated. Continue to Monitor.01/12/2019. 4. Chronic Pain Syndrome: Refilled: Oxymorphone10 mg one tablet every 8 hours as needed for pain #90.  We will continue the opioid monitoring program, this consists of regular clinic visits, examinations, urine drug screen, pill counts as well as use of [place] Controlled Substance Reporting system.  15 minutes of face to face patient care time was spent during this visit. All questions were encouraged and answered.  F/U in 1 month.

## 2019-02-10 ENCOUNTER — Encounter: Payer: Self-pay | Admitting: Registered Nurse

## 2019-02-10 ENCOUNTER — Encounter: Payer: Medicare HMO | Attending: Physical Medicine & Rehabilitation | Admitting: Registered Nurse

## 2019-02-10 ENCOUNTER — Other Ambulatory Visit: Payer: Self-pay

## 2019-02-10 VITALS — BP 131/78 | HR 74 | Temp 97.5°F | Resp 16 | Ht 70.0 in | Wt 197.0 lb

## 2019-02-10 DIAGNOSIS — M1711 Unilateral primary osteoarthritis, right knee: Secondary | ICD-10-CM | POA: Insufficient documentation

## 2019-02-10 DIAGNOSIS — M47812 Spondylosis without myelopathy or radiculopathy, cervical region: Secondary | ICD-10-CM | POA: Insufficient documentation

## 2019-02-10 DIAGNOSIS — M4726 Other spondylosis with radiculopathy, lumbar region: Secondary | ICD-10-CM | POA: Diagnosis not present

## 2019-02-10 DIAGNOSIS — M4722 Other spondylosis with radiculopathy, cervical region: Secondary | ICD-10-CM | POA: Diagnosis not present

## 2019-02-10 DIAGNOSIS — Z79891 Long term (current) use of opiate analgesic: Secondary | ICD-10-CM

## 2019-02-10 DIAGNOSIS — Z87891 Personal history of nicotine dependence: Secondary | ICD-10-CM | POA: Insufficient documentation

## 2019-02-10 DIAGNOSIS — Z5181 Encounter for therapeutic drug level monitoring: Secondary | ICD-10-CM

## 2019-02-10 DIAGNOSIS — S4491XS Injury of unspecified nerve at shoulder and upper arm level, right arm, sequela: Secondary | ICD-10-CM | POA: Diagnosis not present

## 2019-02-10 DIAGNOSIS — G894 Chronic pain syndrome: Secondary | ICD-10-CM | POA: Diagnosis present

## 2019-02-10 DIAGNOSIS — M792 Neuralgia and neuritis, unspecified: Secondary | ICD-10-CM | POA: Diagnosis not present

## 2019-02-10 DIAGNOSIS — M5136 Other intervertebral disc degeneration, lumbar region: Secondary | ICD-10-CM | POA: Diagnosis not present

## 2019-02-10 MED ORDER — OXYMORPHONE HCL 10 MG PO TABS: 10.0000 mg | ORAL_TABLET | Freq: Three times a day (TID) | ORAL | 0 refills | Status: DC | PRN

## 2019-02-10 NOTE — Progress Notes (Signed)
Subjective:    Patient ID: Erik Taylor, male    DOB: May 08, 1956, 63 y.o.   MRN: [id number]  HPI: Erik Taylor is a 63 y.o. male who returns for follow up appointment for chronic pain and medication refill. He states his  pain is located in his neck radiating into his right arm with tingling and numbness and lower back pain. He rates his pain 7. His current exercise regime is walking and performing stretching exercises.  Erik Taylor states three weeks ago his foot became  hung up on his truck bed he reports andfell on his left side. He was able to pick himself up and didn't seek medical attention.   Erik Taylor equivalent is 30.29  MME.  Last UDS was Performed on 09/26/2018, it was consistent.   Pain Inventory Average Pain 6 Pain Right Now 7 My pain is sharp, burning, dull, stabbing, tingling and aching  In the last 24 hours, has pain interfered with the following? General activity 6 Relation with others 4 Enjoyment of life 6 What TIME of day is your pain at its worst? daytime Sleep (in general) Fair  Pain is worse with: walking, bending, sitting, inactivity, standing and some activites Pain improves with: rest, heat/ice and medication Relief from Meds: 6  Mobility walk without assistance ability to climb steps?  yes do you drive?  yes  Function disabled: date disabled . I need assistance with the following:  dressing, meal prep, household duties and shopping  Neuro/Psych weakness numbness spasms  Prior Studies Any changes since last visit?  no  Physicians involved in your care Any changes since last visit?  no   No family history on file. Social History   Socioeconomic History  . Marital status: Married    Spouse name: Not on file  . Number of children: Not on file  . Years of education: Not on file  . Highest education level: Not on file  Occupational History  . Not on file  Social Needs  . Financial resource strain: Not on file  . Food  insecurity    Worry: Not on file    Inability: Not on file  . Transportation needs    Medical: Not on file    Non-medical: Not on file  Tobacco Use  . Smoking status: Former Smoker    Types: Cigarettes    Quit date: 02/01/1994    Years since quitting: 25.0  . Smokeless tobacco: Never Used  Substance and Sexual Activity  . Alcohol use: Not on file  . Drug use: Not on file  . Sexual activity: Not on file  Lifestyle  . Physical activity    Days per week: Not on file    Minutes per session: Not on file  . Stress: Not on file  Relationships  . Social Herbalist on phone: Not on file    Gets together: Not on file    Attends religious service: Not on file    Active member of club or organization: Not on file    Attends meetings of clubs or organizations: Not on file    Relationship status: Not on file  Other Topics Concern  . Not on file  Social History Narrative  . Not on file   No past surgical history on file. No past medical history on file. There were no vitals taken for this visit.  Opioid Risk Score:   Fall Risk Score:  `1  Depression screen [facility] 2/9  [facility] 05/30/2018 03/30/2018 02/01/2018  Decreased Interest 0 0 2  Down, Depressed, Hopeless 0 0 1  PHQ - 2 Score 0 0 3  Altered sleeping - - 2  Tired, decreased energy - - 2  Change in appetite - - 0  Feeling bad or failure about yourself  - - 2  Trouble concentrating - - 2  Moving slowly or fidgety/restless - - 0  Suicidal thoughts - - 0  PHQ-9 Score - - 11  Difficult doing work/chores - - Very difficult     Review of Systems  Constitutional: Negative.   HENT: Negative.   Eyes: Negative.   Respiratory: Negative.   Cardiovascular: Negative.   Gastrointestinal: Negative.   Endocrine: Negative.   Genitourinary: Negative.   Musculoskeletal: Positive for arthralgias, back pain, myalgias and neck pain.  Skin: Negative.   Allergic/Immunologic: Negative.   Neurological: Positive  for weakness and numbness.  Hematological: Negative.   Psychiatric/Behavioral: Negative.        Objective:   Physical Exam Vitals signs and nursing note reviewed.  Constitutional:      Appearance: Normal appearance.  Neck:     Musculoskeletal: Normal range of motion and neck supple.  Cardiovascular:     Rate and Rhythm: Normal rate and regular rhythm.     Pulses: Normal pulses.     Heart sounds: Normal heart sounds.  Pulmonary:     Effort: Pulmonary effort is normal.     Breath sounds: Normal breath sounds.  Musculoskeletal:     Comments: Normal Muscle Bulk and Muscle Testing Reveals:  Upper Extremities: Right: Decreased ROM 90 Degrees  and Muscle Strength 4/5  Left: Full ROM and Muscle Strength 4/5  Lumbar Paraspinal Tenderness: L-4-L-5 Lower Extremities: Full ROM and Muscle Strength 5/5 Arises from chair with ease Narrow Based  Gait   Skin:    General: Skin is warm and dry.  Neurological:     Mental Status: He is alert and oriented to person, place, and time.  Psychiatric:        Mood and Affect: Mood normal.        Behavior: Behavior normal.           Assessment & Plan:  .Cervicalgia/Cervical Spondylosis with Radiculopathy: Continue HEP as Tolerated andTopamax:02/10/2019. 2. Lumbar Spondylosis with Radiculopathy. Continue HEP as Tolerated andTopamax: Erik Taylor reports Gabapentin and Lyrica ineffective.02/10/2019 3. Injury of right shoulder and right upper arm. Continue HEP as Tolerated. Continue to Monitor.02/10/2019. 4. Chronic Pain Syndrome: Refilled: Oxymorphone10 mg one tablet every 8 hours as needed for pain #90.  We will continue the opioid monitoring program, this consists of regular clinic visits, examinations, urine drug screen, pill counts as well as use of [place] Cont[place] system.  15 minutes of face to face patient care time was spent during this visit. All questions were encouraged and answered.  F/U in 1  month.

## 2019-03-08 ENCOUNTER — Other Ambulatory Visit: Payer: Self-pay | Admitting: Physical Medicine & Rehabilitation

## 2019-03-08 DIAGNOSIS — S4491XS Injury of unspecified nerve at shoulder and upper arm level, right arm, sequela: Secondary | ICD-10-CM

## 2019-03-08 DIAGNOSIS — M4722 Other spondylosis with radiculopathy, cervical region: Secondary | ICD-10-CM

## 2019-03-10 ENCOUNTER — Other Ambulatory Visit: Payer: Self-pay

## 2019-03-10 ENCOUNTER — Encounter: Payer: Self-pay | Admitting: Registered Nurse

## 2019-03-10 ENCOUNTER — Encounter: Payer: Medicare HMO | Attending: Physical Medicine & Rehabilitation | Admitting: Registered Nurse

## 2019-03-10 VITALS — BP 130/80 | HR 70 | Temp 98.5°F | Ht 70.0 in | Wt 198.2 lb

## 2019-03-10 DIAGNOSIS — M1711 Unilateral primary osteoarthritis, right knee: Secondary | ICD-10-CM | POA: Insufficient documentation

## 2019-03-10 DIAGNOSIS — Z5181 Encounter for therapeutic drug level monitoring: Secondary | ICD-10-CM

## 2019-03-10 DIAGNOSIS — M4722 Other spondylosis with radiculopathy, cervical region: Secondary | ICD-10-CM | POA: Diagnosis not present

## 2019-03-10 DIAGNOSIS — S4491XS Injury of unspecified nerve at shoulder and upper arm level, right arm, sequela: Secondary | ICD-10-CM

## 2019-03-10 DIAGNOSIS — M792 Neuralgia and neuritis, unspecified: Secondary | ICD-10-CM

## 2019-03-10 DIAGNOSIS — M542 Cervicalgia: Secondary | ICD-10-CM

## 2019-03-10 DIAGNOSIS — Z87891 Personal history of nicotine dependence: Secondary | ICD-10-CM | POA: Insufficient documentation

## 2019-03-10 DIAGNOSIS — G894 Chronic pain syndrome: Secondary | ICD-10-CM | POA: Diagnosis not present

## 2019-03-10 DIAGNOSIS — M5136 Other intervertebral disc degeneration, lumbar region: Secondary | ICD-10-CM | POA: Insufficient documentation

## 2019-03-10 DIAGNOSIS — M47812 Spondylosis without myelopathy or radiculopathy, cervical region: Secondary | ICD-10-CM | POA: Insufficient documentation

## 2019-03-10 DIAGNOSIS — M4726 Other spondylosis with radiculopathy, lumbar region: Secondary | ICD-10-CM

## 2019-03-10 DIAGNOSIS — Z79891 Long term (current) use of opiate analgesic: Secondary | ICD-10-CM

## 2019-03-10 MED ORDER — OXYMORPHONE HCL 10 MG PO TABS: 10.0000 mg | ORAL_TABLET | Freq: Three times a day (TID) | ORAL | 0 refills | Status: DC | PRN

## 2019-03-10 NOTE — Patient Instructions (Addendum)
You May Increase You're Topamax to two capsules at Bedtime. Call office next week to evaluate medication changes.

## 2019-03-10 NOTE — Progress Notes (Signed)
Subjective:    Patient ID: Erik Taylor, male    DOB: Feb 05, 1956, 63 y.o.   MRN: [id number]  HPI: Erik Taylor is a 63 y.o. male who returns for follow up appointment for chronic pain and medication refill. He states his  pain is located in his neck radiating into his right arm and lower back pain. Also reports increase intensity and frequency of nerve pain, we will increase his Topamax today to 50 mg HS, he was instructed to call office next week for medication evaluation. He rates his  Pain 7-8. His  current exercise regime is walking and performing stretching exercises.  Erik Taylor equivalent is 90.00 MME.  Last UDS was Performed on 09/15/2018, it was consistent.   Pain Inventory Average Pain 6-7 Pain Right Now 7-8 My pain is sharp, burning, dull, stabbing, tingling and aching  In the last 24 hours, has pain interfered with the following? General activity 7-8 Relation with others 4 Enjoyment of life 6-7 What TIME of day is your pain at its worst? daytime Sleep (in general) Fair  Pain is worse with: walking, bending, sitting, inactivity, standing and some activites Pain improves with: rest, heat/ice and medication Relief from Meds: 6-7  Mobility walk without assistance ability to climb steps?  yes do you drive?  yes transfers alone Do you have any goals in this area?  no  Function disabled: date disabled 21 I need assistance with the following:  dressing, meal prep, household duties and shopping  Neuro/Psych weakness numbness tingling spasms  Prior Studies Any changes since last visit?  no  Physicians involved in your care Any changes since last visit?  no   No family history on file. Social History   Socioeconomic History  . Marital status: Married    Spouse name: Not on file  . Number of children: Not on file  . Years of education: Not on file  . Highest education level: Not on file  Occupational History  . Not on file  Social Needs   . Financial resource strain: Not on file  . Food insecurity    Worry: Not on file    Inability: Not on file  . Transportation needs    Medical: Not on file    Non-medical: Not on file  Tobacco Use  . Smoking status: Former Smoker    Types: Cigarettes    Quit date: 02/01/1994    Years since quitting: 25.1  . Smokeless tobacco: Never Used  Substance and Sexual Activity  . Alcohol use: Not on file  . Drug use: Not on file  . Sexual activity: Not on file  Lifestyle  . Physical activity    Days per week: Not on file    Minutes per session: Not on file  . Stress: Not on file  Relationships  . Social Herbalist on phone: Not on file    Gets together: Not on file    Attends religious service: Not on file    Active member of club or organization: Not on file    Attends meetings of clubs or organizations: Not on file    Relationship status: Not on file  Other Topics Concern  . Not on file  Social History Narrative  . Not on file   No past surgical history on file. No past medical history on file. BP 130/80 (BP Location: Right Arm)   Pulse 70   Temp 98.5 F (36.9 C)   Ht 5'  10" (1.778 m)   Wt 198 lb 3.2 oz (89.9 kg)   SpO2 98%   BMI 28.44 kg/m   Opioid Risk Score:   Fall Risk Score:  `1  Depression screen PHQ 2/9  [facility] 05/30/2018 03/30/2018 02/01/2018  Decreased Interest 0 0 2  Down, Depressed, Hopeless 0 0 1  PHQ - 2 Score 0 0 3  Altered sleeping - - 2  Tired, decreased energy - - 2  Change in appetite - - 0  Feeling bad or failure about yourself  - - 2  Trouble concentrating - - 2  Moving slowly or fidgety/restless - - 0  Suicidal thoughts - - 0  PHQ-9 Score - - 11  Difficult doing work/chores - - Very difficult    Review of Systems  Constitutional: Negative.   HENT: Negative.   Eyes: Negative.   Respiratory: Negative.   Cardiovascular: Negative.   Gastrointestinal: Negative.   Endocrine: Negative.   Genitourinary:  Negative.   Musculoskeletal: Positive for back pain and neck pain.  Skin: Negative.   Allergic/Immunologic: Negative.   Neurological: Positive for weakness and numbness.  Hematological: Negative.   Psychiatric/Behavioral: Negative.   All other systems reviewed and are negative.      Objective:   Physical Exam Vitals signs and nursing note reviewed.  Constitutional:      Appearance: Normal appearance.  Neck:     Musculoskeletal: Normal range of motion and neck supple.  Cardiovascular:     Rate and Rhythm: Normal rate and regular rhythm.     Pulses: Normal pulses.     Heart sounds: Normal heart sounds.  Pulmonary:     Effort: Pulmonary effort is normal.     Breath sounds: Normal breath sounds.  Musculoskeletal:     Comments: Normal Muscle Bulk and Muscle Testing Reveals:  Upper Extremities: Right Decreased ROM 90 Degrees and Muscle Strength 4/5 and Left Full ROM and Muscle Strength 5/5   Lumbar Paraspinal Tenderness: L-3-L-5 Lower Extremities: Full ROM and Muscle Strength 5/5 Arises from chair with ease Narrow Based Gait   Skin:    General: Skin is warm and dry.  Neurological:     Mental Status: He is alert and oriented to person, place, and time.  Psychiatric:        Mood and Affect: Mood normal.        Behavior: Behavior normal.           Assessment & Plan:  1.Cervicalgia/Cervical Spondylosis with Radiculopathy: Continue HEP as Tolerated andTopamax:03/10/2019. 2. Lumbar Spondylosis with Radiculopathy. Continue HEP as Tolerated andTopamax Increased to 50 hs.  Erik Taylor reports Gabapentin and Lyrica ineffective.03/10/2019 3. Injury of right shoulder and right upper arm. Continue HEP as Tolerated. Continue to Monitor.03/10/2019. 4. Chronic Pain Syndrome: Refilled: Oxymorphone10 mg one tablet every 8 hours as needed for pain #90.  We will continue the opioid monitoring program, this consists of regular clinic visits, examinations, urine drug screen, pill  counts as well as use of [place] Cont[place] Substance Reporting system.  15minutes of face to face patient care time was spent during this visit. All questions were encouraged and answered.  F/U in 1 month.

## 2019-03-21 ENCOUNTER — Telehealth: Payer: Self-pay | Admitting: Registered Nurse

## 2019-03-21 DIAGNOSIS — M4722 Other spondylosis with radiculopathy, cervical region: Secondary | ICD-10-CM

## 2019-03-21 DIAGNOSIS — S4491XS Injury of unspecified nerve at shoulder and upper arm level, right arm, sequela: Secondary | ICD-10-CM

## 2019-03-21 MED ORDER — TOPIRAMATE 50 MG PO TABS: 50.0000 mg | ORAL_TABLET | Freq: Every day | ORAL | 2 refills | Status: DC

## 2019-03-21 NOTE — Telephone Encounter (Signed)
Topamax prescription, sent to pharmacy. This provider sent message to Erik Taylor via My-Chart

## 2019-03-21 NOTE — Telephone Encounter (Signed)
Message from patient

## 2019-04-10 ENCOUNTER — Encounter: Payer: Self-pay | Admitting: Registered Nurse

## 2019-04-10 ENCOUNTER — Encounter: Payer: Medicare HMO | Attending: Physical Medicine & Rehabilitation | Admitting: Registered Nurse

## 2019-04-10 ENCOUNTER — Other Ambulatory Visit: Payer: Self-pay

## 2019-04-10 VITALS — BP 132/79 | HR 72 | Temp 98.0°F | Ht 70.0 in | Wt 198.0 lb

## 2019-04-10 DIAGNOSIS — M5412 Radiculopathy, cervical region: Secondary | ICD-10-CM

## 2019-04-10 DIAGNOSIS — Z79891 Long term (current) use of opiate analgesic: Secondary | ICD-10-CM

## 2019-04-10 DIAGNOSIS — Z87891 Personal history of nicotine dependence: Secondary | ICD-10-CM | POA: Diagnosis not present

## 2019-04-10 DIAGNOSIS — S4491XS Injury of unspecified nerve at shoulder and upper arm level, right arm, sequela: Secondary | ICD-10-CM | POA: Diagnosis not present

## 2019-04-10 DIAGNOSIS — M545 Low back pain, unspecified: Secondary | ICD-10-CM

## 2019-04-10 DIAGNOSIS — M4722 Other spondylosis with radiculopathy, cervical region: Secondary | ICD-10-CM | POA: Diagnosis not present

## 2019-04-10 DIAGNOSIS — Z5181 Encounter for therapeutic drug level monitoring: Secondary | ICD-10-CM

## 2019-04-10 DIAGNOSIS — G8929 Other chronic pain: Secondary | ICD-10-CM

## 2019-04-10 DIAGNOSIS — M792 Neuralgia and neuritis, unspecified: Secondary | ICD-10-CM

## 2019-04-10 DIAGNOSIS — M47812 Spondylosis without myelopathy or radiculopathy, cervical region: Secondary | ICD-10-CM | POA: Diagnosis not present

## 2019-04-10 DIAGNOSIS — M5136 Other intervertebral disc degeneration, lumbar region: Secondary | ICD-10-CM | POA: Insufficient documentation

## 2019-04-10 DIAGNOSIS — M1711 Unilateral primary osteoarthritis, right knee: Secondary | ICD-10-CM | POA: Insufficient documentation

## 2019-04-10 DIAGNOSIS — G894 Chronic pain syndrome: Secondary | ICD-10-CM | POA: Diagnosis present

## 2019-04-10 DIAGNOSIS — M542 Cervicalgia: Secondary | ICD-10-CM

## 2019-04-10 DIAGNOSIS — M4726 Other spondylosis with radiculopathy, lumbar region: Secondary | ICD-10-CM

## 2019-04-10 MED ORDER — OXYMORPHONE HCL 10 MG PO TABS: 10.0000 mg | ORAL_TABLET | Freq: Three times a day (TID) | ORAL | 0 refills | Status: DC | PRN

## 2019-04-10 NOTE — Progress Notes (Signed)
Subjective:    Patient ID: Erik Taylor, male    DOB: [birth year], 63 y.o.   MRN: [id number]  HPI: Erik Taylor is a 63 y.o. male who returns for follow up appointment for chronic pain and medication refill. He states his pain is located in his neck radiating into her bilateral shoulders and right arm with tingling and Lower back pain. He rates his pain 8. His  current exercise regime is walking.  Erik Taylor Morphine equivalent is 90.00 MME.  Last UDS was Performed on 09/15/2018, it was consistent.  Pain Inventory Average Pain 6 Pain Right Now 8 My pain is sharp, burning, dull, stabbing, tingling and aching  In the last 24 hours, has pain interfered with the following? General activity 7 Relation with others 4 Enjoyment of life 7 What TIME of day is your pain at its worst? daytime Sleep (in general) Fair  Pain is worse with: walking, bending, sitting, inactivity, standing and some activites Pain improves with: rest, heat/ice and medication Relief from Meds: 7  Mobility walk without assistance ability to climb steps?  yes do you drive?  yes  Function disabled: date disabled . I need assistance with the following:  meal prep, household duties and shopping  Neuro/Psych weakness numbness spasms  Prior Studies Any changes since last visit?  no  Physicians involved in your care Any changes since last visit?  no   No family history on file. Social History   Socioeconomic History  . Marital status: Married    Spouse name: Not on file  . Number of children: Not on file  . Years of education: Not on file  . Highest education level: Not on file  Occupational History  . Not on file  Social Needs  . Financial resource strain: Not on file  . Food insecurity    Worry: Not on file    Inability: Not on file  . Transportation needs    Medical: Not on file    Non-medical: Not on file  Tobacco Use  . Smoking status: Former Smoker    Types: Cigarettes    Quit  date: 02/01/1994    Years since quitting: 25.2  . Smokeless tobacco: Never Used  Substance and Sexual Activity  . Alcohol use: Not on file  . Drug use: Not on file  . Sexual activity: Not on file  Lifestyle  . Physical activity    Days per week: Not on file    Minutes per session: Not on file  . Stress: Not on file  Relationships  . Social Herbalist on phone: Not on file    Gets together: Not on file    Attends religious service: Not on file    Active member of club or organization: Not on file    Attends meetings of clubs or organizations: Not on file    Relationship status: Not on file  Other Topics Concern  . Not on file  Social History Narrative  . Not on file   No past surgical history on file. No past medical history on file. There were no vitals taken for this visit.  Opioid Risk Score:   Fall Risk Score:  `1  Depression screen PHQ 2/9  Depression screen [facility] 2/9 05/30/2018 03/30/2018 02/01/2018  Decreased Interest 0 0 2  Down, Depressed, Hopeless 0 0 1  PHQ - 2 Score 0 0 3  Altered sleeping - - 2  Tired, decreased energy - - 2  Change in appetite - - 0  Feeling bad or failure about yourself  - - 2  Trouble concentrating - - 2  Moving slowly or fidgety/restless - - 0  Suicidal thoughts - - 0  PHQ-9 Score - - 11  Difficult doing work/chores - - Very difficult     Review of Systems  Constitutional: Negative.   HENT: Negative.   Eyes: Negative.   Respiratory: Negative.   Cardiovascular: Negative.   Gastrointestinal: Negative.   Endocrine: Negative.   Genitourinary: Negative.   Musculoskeletal: Positive for arthralgias, back pain and myalgias.  Skin: Negative.   Allergic/Immunologic: Negative.   Neurological: Positive for weakness and numbness.  Hematological: Negative.   Psychiatric/Behavioral: Negative.   All other systems reviewed and are negative.      Objective:   Physical Exam Vitals signs and nursing note reviewed.   Constitutional:      Appearance: Normal appearance.  Neck:     Musculoskeletal: Normal range of motion and neck supple.     Comments: Cervical Paraspinal Tenderness: C-5-C-6 Cardiovascular:     Rate and Rhythm: Normal rate and regular rhythm.     Pulses: Normal pulses.     Heart sounds: Normal heart sounds.  Pulmonary:     Effort: Pulmonary effort is normal.     Breath sounds: Normal breath sounds.  Musculoskeletal:     Comments: Normal Muscle Bulk and Muscle Testing Reveals:  Upper Extremities: Full ROM and Muscle Strength on the Right 4/5 and Left 5/5 Bilateral AC Joint Tenderness  Thoracic Paraspinal Tenderness: T-1-T-3 Lumbar Paraspinal Tenderness: L-3-L-5 Lower Extremities: Full ROM and Muscle Strength 5/5 Arises from chair with ease Narrow Based Gait   Skin:    General: Skin is warm and dry.  Neurological:     Mental Status: He is alert and oriented to person, place, and time.  Psychiatric:        Mood and Affect: Mood normal.        Behavior: Behavior normal.           Assessment & Plan:  1.Cervicalgia/Cervical Spondylosis with Radiculopathy: Continue HEP as Tolerated andTopamax:04/10/2019. 2. Lumbar Spondylosis with Radiculopathy. Continue HEP as Tolerated andTopamax.  Erik Taylor reports Gabapentin and Lyrica ineffective.04/10/2019 3. Injury of right shoulder and right upper arm. Continue HEP as Tolerated. Continue to Monitor.04/10/2019. 4. Chronic Pain Syndrome: Refilled: Oxymorphone10 mg one tablet every 8 hours as needed for pain #90. 04/10/2019. We will continue the opioid monitoring program, this consists of regular clinic visits, examinations, urine drug screen, pill counts as well as use of [place] Controlled Substance Reporting system.  of face to face patient care time was spent during this visit. All questions were encouraged and answered.  F/U in 1 month.

## 2019-05-08 ENCOUNTER — Encounter: Payer: Self-pay | Admitting: Registered Nurse

## 2019-05-08 ENCOUNTER — Encounter: Payer: Medicare HMO | Attending: Physical Medicine & Rehabilitation | Admitting: Registered Nurse

## 2019-05-08 ENCOUNTER — Other Ambulatory Visit: Payer: Self-pay

## 2019-05-08 VITALS — BP 146/84 | HR 76 | Temp 97.9°F | Ht 70.0 in | Wt 201.0 lb

## 2019-05-08 DIAGNOSIS — Z5181 Encounter for therapeutic drug level monitoring: Secondary | ICD-10-CM

## 2019-05-08 DIAGNOSIS — G894 Chronic pain syndrome: Secondary | ICD-10-CM

## 2019-05-08 DIAGNOSIS — M4722 Other spondylosis with radiculopathy, cervical region: Secondary | ICD-10-CM | POA: Diagnosis present

## 2019-05-08 DIAGNOSIS — M5412 Radiculopathy, cervical region: Secondary | ICD-10-CM

## 2019-05-08 DIAGNOSIS — M4726 Other spondylosis with radiculopathy, lumbar region: Secondary | ICD-10-CM

## 2019-05-08 DIAGNOSIS — M47812 Spondylosis without myelopathy or radiculopathy, cervical region: Secondary | ICD-10-CM | POA: Diagnosis not present

## 2019-05-08 DIAGNOSIS — Z87891 Personal history of nicotine dependence: Secondary | ICD-10-CM | POA: Insufficient documentation

## 2019-05-08 DIAGNOSIS — Z79891 Long term (current) use of opiate analgesic: Secondary | ICD-10-CM

## 2019-05-08 DIAGNOSIS — M1711 Unilateral primary osteoarthritis, right knee: Secondary | ICD-10-CM | POA: Insufficient documentation

## 2019-05-08 DIAGNOSIS — M5136 Other intervertebral disc degeneration, lumbar region: Secondary | ICD-10-CM | POA: Insufficient documentation

## 2019-05-08 DIAGNOSIS — S4491XS Injury of unspecified nerve at shoulder and upper arm level, right arm, sequela: Secondary | ICD-10-CM

## 2019-05-08 DIAGNOSIS — M542 Cervicalgia: Secondary | ICD-10-CM | POA: Diagnosis not present

## 2019-05-08 DIAGNOSIS — M792 Neuralgia and neuritis, unspecified: Secondary | ICD-10-CM

## 2019-05-08 MED ORDER — OXYMORPHONE HCL 10 MG PO TABS: 10.0000 mg | ORAL_TABLET | Freq: Three times a day (TID) | ORAL | 0 refills | Status: DC | PRN

## 2019-05-08 NOTE — Progress Notes (Signed)
Subjective:    Patient ID: Erik Taylor, male    DOB: 10/22/1955, 63 y.o.   MRN: [id number]  HPI: Erik Taylor is a 63 y.o. male who returns for follow up appointment for chronic pain and medication refill. He states his pain is located in his neck radiating into her right shoulder, right arm pain with tingling and burning and lower back pain. He rates his pain 8. His  current exercise regime is walking and performing stretching exercises.  Erik Taylor Morphine equivalent is 90.00  MME.  UDS ordered Today.   Pain Inventory Average Pain 8 Pain Right Now 8 My pain is sharp, burning, dull, stabbing, tingling and aching  In the last 24 hours, has pain interfered with the following? General activity 7 Relation with others 4 Enjoyment of life 7 What TIME of day is your pain at its worst? daytime Sleep (in general) Poor  Pain is worse with: walking, bending, sitting, inactivity, standing and some activites Pain improves with: rest, heat/ice and medication Relief from Meds: 5  Mobility walk without assistance ability to climb steps?  yes do you drive?  yes  Function disabled: date disabled 48 I need assistance with the following:  meal prep, household duties and shopping  Neuro/Psych weakness numbness spasms  Prior Studies Any changes since last visit?  no  Physicians involved in your care Any changes since last visit?  no   No family history on file. Social History   Socioeconomic History  . Marital status: Married    Spouse name: Not on file  . Number of children: Not on file  . Years of education: Not on file  . Highest education level: Not on file  Occupational History  . Not on file  Social Needs  . Financial resource strain: Not on file  . Food insecurity    Worry: Not on file    Inability: Not on file  . Transportation needs    Medical: Not on file    Non-medical: Not on file  Tobacco Use  . Smoking status: Former Smoker    Types: Cigarettes     Quit date: 02/01/1994    Years since quitting: 25.2  . Smokeless tobacco: Never Used  Substance and Sexual Activity  . Alcohol use: Not on file  . Drug use: Not on file  . Sexual activity: Not on file  Lifestyle  . Physical activity    Days per week: Not on file    Minutes per session: Not on file  . Stress: Not on file  Relationships  . Social Musician on phone: Not on file    Gets together: Not on file    Attends religious service: Not on file    Active member of club or organization: Not on file    Attends meetings of clubs or organizations: Not on file    Relationship status: Not on file  Other Topics Concern  . Not on file  Social History Narrative  . Not on file   No past surgical history on file. No past medical history on file. BP (!) 146/84   Pulse 76   Temp 97.9 F (36.6 C)   Ht 5\' 10"  (1.778 m)   Wt 201 lb (91.2 kg)   SpO2 97%   BMI 28.84 kg/m   Opioid Risk Score:   Fall Risk Score:  `1  Depression screen PHQ 2/9  Depression screen [facility] 2/9 05/30/2018 03/30/2018 02/01/2018  Decreased Interest  0 0 2  Down, Depressed, Hopeless 0 0 1  PHQ - 2 Score 0 0 3  Altered sleeping - - 2  Tired, decreased energy - - 2  Change in appetite - - 0  Feeling bad or failure about yourself  - - 2  Trouble concentrating - - 2  Moving slowly or fidgety/restless - - 0  Suicidal thoughts - - 0  PHQ-9 Score - - 11  Difficult doing work/chores - - Very difficult     Review of Systems  Constitutional: Negative.   HENT: Negative.   Eyes: Negative.   Respiratory: Negative.   Cardiovascular: Negative.   Gastrointestinal: Negative.   Endocrine: Negative.   Genitourinary: Negative.   Musculoskeletal: Positive for arthralgias, back pain and myalgias.  Skin: Negative.   Allergic/Immunologic: Negative.   Neurological: Positive for numbness.  Hematological: Negative.   Psychiatric/Behavioral: Negative.   All other systems reviewed and are negative.       Objective:   Physical Exam Vitals signs and nursing note reviewed.  Constitutional:      Appearance: Normal appearance.  Neck:     Musculoskeletal: Normal range of motion and neck supple.  Cardiovascular:     Rate and Rhythm: Normal rate and regular rhythm.     Pulses: Normal pulses.     Heart sounds: Normal heart sounds.  Pulmonary:     Effort: Pulmonary effort is normal.     Breath sounds: Normal breath sounds.  Musculoskeletal:     Comments: Normal Muscle Bulk and Muscle Testing Reveals:  Upper Extremities: Full ROM and Muscle Strength on Right 4/5 and Left 5/5  Thoracic Paraspinal Tenderness: T-1-T-4 Hypersensitivity Lumbar Paraspinal Tenderness: L-3-L-5   Lower Extremities: Full ROM and Muscle Strength 5/5 Arises from chair with ease Narrow Based  Gait   Skin:    General: Skin is warm and dry.  Neurological:     Mental Status: He is alert and oriented to person, place, and time.  Psychiatric:        Mood and Affect: Mood normal.        Behavior: Behavior normal.           Assessment & Plan:  1.Cervicalgia/Cervical Spondylosis with Radiculopathy: Continue HEP as Tolerated andTopamax:05/08/2019. 2. Lumbar Spondylosis with Radiculopathy. Continue HEP as Tolerated andTopamax. Erik Taylor reports Gabapentin and Lyrica ineffective.05/08/2019 3. Injury of right shoulder and right upper arm. Continue HEP as Tolerated. Continue to Monitor.05/08/2019. 4. Chronic Pain Syndrome: Refilled: Oxymorphone10 mg one tablet every 8 hours as needed for pain #90. 05/08/2019. We will continue the opioid monitoring program, this consists of regular clinic visits, examinations, urine drug screen, pill counts as well as use of [place] Controlled Substance Reporting system.  56minutes of face to face patient care time was spent during this visit. All questions were encouraged and answered.  F/U in 1 month.

## 2019-05-11 LAB — TOXASSURE SELECT,+ANTIDEPR,UR

## 2019-05-15 ENCOUNTER — Telehealth: Payer: Self-pay | Admitting: *Deleted

## 2019-05-15 NOTE — Telephone Encounter (Signed)
Urine drug screen for this encounter is consistent for prescribed medication 

## 2019-06-07 ENCOUNTER — Other Ambulatory Visit: Payer: Self-pay

## 2019-06-07 ENCOUNTER — Encounter: Payer: Medicare HMO | Attending: Physical Medicine & Rehabilitation | Admitting: Registered Nurse

## 2019-06-07 ENCOUNTER — Encounter: Payer: Self-pay | Admitting: Registered Nurse

## 2019-06-07 VITALS — BP 146/88 | HR 91 | Ht 70.0 in | Wt 200.6 lb

## 2019-06-07 DIAGNOSIS — Z5181 Encounter for therapeutic drug level monitoring: Secondary | ICD-10-CM

## 2019-06-07 DIAGNOSIS — M792 Neuralgia and neuritis, unspecified: Secondary | ICD-10-CM

## 2019-06-07 DIAGNOSIS — M5136 Other intervertebral disc degeneration, lumbar region: Secondary | ICD-10-CM | POA: Diagnosis not present

## 2019-06-07 DIAGNOSIS — G894 Chronic pain syndrome: Secondary | ICD-10-CM

## 2019-06-07 DIAGNOSIS — M1711 Unilateral primary osteoarthritis, right knee: Secondary | ICD-10-CM | POA: Diagnosis not present

## 2019-06-07 DIAGNOSIS — M4722 Other spondylosis with radiculopathy, cervical region: Secondary | ICD-10-CM

## 2019-06-07 DIAGNOSIS — M4726 Other spondylosis with radiculopathy, lumbar region: Secondary | ICD-10-CM

## 2019-06-07 DIAGNOSIS — M545 Low back pain, unspecified: Secondary | ICD-10-CM

## 2019-06-07 DIAGNOSIS — Z79891 Long term (current) use of opiate analgesic: Secondary | ICD-10-CM | POA: Diagnosis present

## 2019-06-07 DIAGNOSIS — M542 Cervicalgia: Secondary | ICD-10-CM | POA: Diagnosis not present

## 2019-06-07 DIAGNOSIS — M47812 Spondylosis without myelopathy or radiculopathy, cervical region: Secondary | ICD-10-CM | POA: Insufficient documentation

## 2019-06-07 DIAGNOSIS — Z87891 Personal history of nicotine dependence: Secondary | ICD-10-CM | POA: Insufficient documentation

## 2019-06-07 DIAGNOSIS — G8929 Other chronic pain: Secondary | ICD-10-CM

## 2019-06-07 DIAGNOSIS — S4491XS Injury of unspecified nerve at shoulder and upper arm level, right arm, sequela: Secondary | ICD-10-CM | POA: Diagnosis present

## 2019-06-07 MED ORDER — OXYMORPHONE HCL 10 MG PO TABS: 10.0000 mg | ORAL_TABLET | Freq: Three times a day (TID) | ORAL | 0 refills | Status: DC | PRN

## 2019-06-07 MED ORDER — TOPIRAMATE 50 MG PO TABS: 50.0000 mg | ORAL_TABLET | Freq: Every day | ORAL | 2 refills | Status: DC

## 2019-06-07 NOTE — Progress Notes (Signed)
Subjective:    Patient ID: Erik Taylor, male    DOB: 06-18-1956, 63 y.o.   MRN: [id number]  HPI: NYSIR FERGUSSON is a 63 y.o. male who returns for follow up appointment for chronic pain and medication refill. He states his pain is located in his neck, right arm and lower back pain. He rates his  Pain 8. . His current exercise regime is walking and performing stretching exercises.  Mr. Caldera Morphine equivalent is 90.00  MME.  Last UDS was Performed on 05/08/2019, it was consistent.    Pain Inventory Average Pain 7 Pain Right Now 8 My pain is constant, sharp, burning, dull, stabbing, tingling and aching  In the last 24 hours, has pain interfered with the following? General activity 7 Relation with others 4 Enjoyment of life 7 What TIME of day is your pain at its worst? daytime Sleep (in general) Fair  Pain is worse with: walking, bending, sitting, inactivity, standing and some activites Pain improves with: rest, heat/ice and medication Relief from Meds: 6  Mobility walk without assistance ability to climb steps?  yes do you drive?  yes transfers alone Do you have any goals in this area?  no  Function disabled: date disabled . I need assistance with the following:  dressing, meal prep, household duties and shopping Do you have any goals in this area?  no  Neuro/Psych weakness numbness spasms  Prior Studies Any changes since last visit?  no  Physicians involved in your care Any changes since last visit?  no   History reviewed. No pertinent family history. Social History   Socioeconomic History  . Marital status: Married    Spouse name: Not on file  . Number of children: Not on file  . Years of education: Not on file  . Highest education level: Not on file  Occupational History  . Not on file  Social Needs  . Financial resource strain: Not on file  . Food insecurity    Worry: Not on file    Inability: Not on file  . Transportation needs   Medical: Not on file    Non-medical: Not on file  Tobacco Use  . Smoking status: Former Smoker    Types: Cigarettes    Quit date: 02/01/1994    Years since quitting: 25.3  . Smokeless tobacco: Never Used  Substance and Sexual Activity  . Alcohol use: Not on file  . Drug use: Not on file  . Sexual activity: Not on file  Lifestyle  . Physical activity    Days per week: Not on file    Minutes per session: Not on file  . Stress: Not on file  Relationships  . Social Herbalist on phone: Not on file    Gets together: Not on file    Attends religious service: Not on file    Active member of club or organization: Not on file    Attends meetings of clubs or organizations: Not on file    Relationship status: Not on file  Other Topics Concern  . Not on file  Social History Narrative  . Not on file   History reviewed. No pertinent surgical history. History reviewed. No pertinent past medical history. BP (!) 146/88 (BP Location: Left Arm, Patient Position: Sitting, Cuff Size: Normal)   Pulse 91   Ht 5\' 10"  (1.778 m)   Wt 200 lb 9.6 oz (91 kg)   SpO2 98%   BMI 28.78 kg/m  Opioid Risk Score:   Fall Risk Score:  `1  Depression screen PHQ 2/9  Depression screen [facility] 2/9 05/30/2018 03/30/2018 02/01/2018  Decreased Interest 0 0 2  Down, Depressed, Hopeless 0 0 1  PHQ - 2 Score 0 0 3  Altered sleeping - - 2  Tired, decreased energy - - 2  Change in appetite - - 0  Feeling bad or failure about yourself  - - 2  Trouble concentrating - - 2  Moving slowly or fidgety/restless - - 0  Suicidal thoughts - - 0  PHQ-9 Score - - 11  Difficult doing work/chores - - Very difficult    Review of Systems  Constitutional: Negative.   HENT: Negative.   Eyes: Negative.   Respiratory: Negative.   Cardiovascular: Negative.   Gastrointestinal: Negative.   Endocrine: Negative.   Genitourinary: Negative.   Musculoskeletal: Positive for arthralgias, back pain, neck pain and neck  stiffness.       Spasms   Skin: Negative.   Allergic/Immunologic: Negative.   Neurological: Positive for weakness and numbness.  Hematological: Negative.   Psychiatric/Behavioral: Negative.   All other systems reviewed and are negative.      Objective:   Physical Exam Vitals signs and nursing note reviewed.  Constitutional:      Appearance: Normal appearance.  Neck:     Musculoskeletal: Normal range of motion and neck supple.     Comments: Cervical Paraspinal Tenderness: C-5-C-6 Cardiovascular:     Rate and Rhythm: Normal rate and regular rhythm.     Pulses: Normal pulses.     Heart sounds: Normal heart sounds.  Pulmonary:     Effort: Pulmonary effort is normal.     Breath sounds: Normal breath sounds.  Musculoskeletal:     Comments: Normal Muscle Bulk and Muscle Testing Reveals:  Upper Extremities: Right: Decreased ROM 90 Degrees  and Muscle Strength 4/5 Left: Full ROM and Muscle Strength 5/5 Lumbar Paraspinal Tenderness: L-3-L-5 Lower Extremities: Full ROM and Muscle Strength 5/5 Arises from chair with ease Narrow Based  Gait   Skin:    General: Skin is warm and dry.  Neurological:     Mental Status: He is alert and oriented to person, place, and time.  Psychiatric:        Mood and Affect: Mood normal.        Behavior: Behavior normal.           Assessment & Plan:  1.Cervicalgia/Cervical Spondylosis with Radiculopathy: Continue HEP as Tolerated andTopamax:06/07/2019. 2. Lumbar Spondylosis with Radiculopathy. Continue HEP as Tolerated andTopamax.Mr. Bovey reports Gabapentin and Lyrica ineffective.06/07/2019 3. Injury of right shoulder and right upper arm. Continue HEP as Tolerated. Continue to Monitor.06/07/2019. 4. Chronic Pain Syndrome: Refilled: Oxymorphone10 mg one tablet every 8 hours as needed for pain #90.06/07/2019. We will continue the opioid monitoring program, this consists of regular clinic visits, examinations, urine drug screen,  pill counts as well as use of [place] Controlled Substance Reporting system.  of face to face patient care time was spent during this visit. All questions were encouraged and answered.  F/U in 1 month.

## 2019-07-03 ENCOUNTER — Telehealth: Payer: Self-pay | Admitting: Registered Nurse

## 2019-07-03 DIAGNOSIS — S4491XS Injury of unspecified nerve at shoulder and upper arm level, right arm, sequela: Secondary | ICD-10-CM

## 2019-07-03 DIAGNOSIS — M4726 Other spondylosis with radiculopathy, lumbar region: Secondary | ICD-10-CM

## 2019-07-03 DIAGNOSIS — M4722 Other spondylosis with radiculopathy, cervical region: Secondary | ICD-10-CM

## 2019-07-03 MED ORDER — OXYMORPHONE HCL 10 MG PO TABS: 10.0000 mg | ORAL_TABLET | Freq: Three times a day (TID) | ORAL | 0 refills | Status: DC | PRN

## 2019-07-03 NOTE — Telephone Encounter (Signed)
PMP was Reviewed: Oxymorphone e-scribed, to accommodate next scheduled appointment.. Mr. Vaile is aware via My-Chart Message.

## 2019-07-14 ENCOUNTER — Encounter: Payer: Medicare HMO | Admitting: Registered Nurse

## 2019-07-26 ENCOUNTER — Encounter: Payer: Self-pay | Admitting: Registered Nurse

## 2019-07-26 ENCOUNTER — Encounter: Payer: Medicare HMO | Attending: Physical Medicine & Rehabilitation | Admitting: Registered Nurse

## 2019-07-26 ENCOUNTER — Other Ambulatory Visit: Payer: Self-pay

## 2019-07-26 VITALS — BP 148/87 | HR 76 | Temp 97.7°F | Ht 70.0 in | Wt 204.0 lb

## 2019-07-26 DIAGNOSIS — M1711 Unilateral primary osteoarthritis, right knee: Secondary | ICD-10-CM | POA: Diagnosis not present

## 2019-07-26 DIAGNOSIS — Z5181 Encounter for therapeutic drug level monitoring: Secondary | ICD-10-CM

## 2019-07-26 DIAGNOSIS — M47812 Spondylosis without myelopathy or radiculopathy, cervical region: Secondary | ICD-10-CM | POA: Insufficient documentation

## 2019-07-26 DIAGNOSIS — M5136 Other intervertebral disc degeneration, lumbar region: Secondary | ICD-10-CM | POA: Diagnosis not present

## 2019-07-26 DIAGNOSIS — Z87891 Personal history of nicotine dependence: Secondary | ICD-10-CM | POA: Diagnosis not present

## 2019-07-26 DIAGNOSIS — M5412 Radiculopathy, cervical region: Secondary | ICD-10-CM | POA: Diagnosis not present

## 2019-07-26 DIAGNOSIS — Z79891 Long term (current) use of opiate analgesic: Secondary | ICD-10-CM | POA: Insufficient documentation

## 2019-07-26 DIAGNOSIS — M4726 Other spondylosis with radiculopathy, lumbar region: Secondary | ICD-10-CM | POA: Diagnosis present

## 2019-07-26 DIAGNOSIS — M4722 Other spondylosis with radiculopathy, cervical region: Secondary | ICD-10-CM | POA: Diagnosis not present

## 2019-07-26 DIAGNOSIS — S4491XS Injury of unspecified nerve at shoulder and upper arm level, right arm, sequela: Secondary | ICD-10-CM | POA: Diagnosis not present

## 2019-07-26 DIAGNOSIS — M19041 Primary osteoarthritis, right hand: Secondary | ICD-10-CM

## 2019-07-26 DIAGNOSIS — M545 Low back pain, unspecified: Secondary | ICD-10-CM

## 2019-07-26 DIAGNOSIS — M19042 Primary osteoarthritis, left hand: Secondary | ICD-10-CM

## 2019-07-26 DIAGNOSIS — G894 Chronic pain syndrome: Secondary | ICD-10-CM | POA: Diagnosis present

## 2019-07-26 DIAGNOSIS — M542 Cervicalgia: Secondary | ICD-10-CM

## 2019-07-26 DIAGNOSIS — G8929 Other chronic pain: Secondary | ICD-10-CM

## 2019-07-26 MED ORDER — OXYMORPHONE HCL 10 MG PO TABS: 10.0000 mg | ORAL_TABLET | Freq: Three times a day (TID) | ORAL | 0 refills | Status: DC | PRN

## 2019-07-26 NOTE — Progress Notes (Signed)
Subjective:    Patient ID: Erik Taylor, male    DOB: Nov 20, 1955, 64 y.o.   MRN: [id number]  HPI: Erik Taylor is a 64 y.o. male who returns for follow up appointment for chronic pain and medication refill. He states his pain is located in his  Bilateral hands, neck radiating into his right shoulder and lower back pain. He rates his pain 8. His current exercise regime is walking and performing stretching exercises.  [name] Morphine equivalent is 90.00 MME.  Last UDS was Performed on 05/08/2019, it was consistent.    Pain Inventory Average Pain 7 Pain Right Now 8 My pain is sharp, burning, dull, stabbing, tingling and aching  In the last 24 hours, has pain interfered with the following? General activity 7 Relation with others 5 Enjoyment of life 7 What TIME of day is your pain at its worst? daytime Sleep (in general) Fair  Pain is worse with: walking, bending, sitting, inactivity, standing and some activites Pain improves with: rest, heat/ice and medication Relief from Meds: 7  Mobility walk without assistance ability to climb steps?  yes do you drive?  yes transfers alone Do you have any goals in this area?  no  Function disabled: date disabled . I need assistance with the following:  meal prep, household duties and shopping Do you have any goals in this area?  no  Neuro/Psych weakness numbness spasms  Prior Studies Any changes since last visit?  no  Physicians involved in your care    History reviewed. No pertinent family history. Social History   Socioeconomic History  . Marital status: Married    Spouse name: Not on file  . Number of children: Not on file  . Years of education: Not on file  . Highest education level: Not on file  Occupational History  . Not on file  Tobacco Use  . Smoking status: Former Smoker    Types: Cigarettes    Quit date: 02/01/1994    Years since quitting: 25.4  . Smokeless tobacco: Never Used  Substance and  Sexual Activity  . Alcohol use: Not on file  . Drug use: Not on file  . Sexual activity: Not on file  Other Topics Concern  . Not on file  Social History Narrative  . Not on file   Social Determinants of Health   Financial Resource Strain:   . Difficulty of Paying Living Expenses: Not on file  Food Insecurity:   . Worried About Charity fundraiser in the Last Year: Not on file  . Ran Out of Food in the Last Year: Not on file  Transportation Needs:   . Lack of Transportation (Medical): Not on file  . Lack of Transportation (Non-Medical): Not on file  Physical Activity:   . Days of Exercise per Week: Not on file  . Minutes of Exercise per Session: Not on file  Stress:   . Feeling of Stress : Not on file  Social Connections:   . Frequency of Communication with Friends and Family: Not on file  . Frequency of Social Gatherings with Friends and Family: Not on file  . Attends Religious Services: Not on file  . Active Member of Clubs or Organizations: Not on file  . Attends Archivist Meetings: Not on file  . Marital Status: Not on file   History reviewed. No pertinent surgical history. History reviewed. No pertinent past medical history. BP (!) 148/87   Pulse 76   Temp  97.7 F (36.5 C)   Ht 5\' 10"  (1.778 m)   Wt 204 lb (92.5 kg)   SpO2 97%   BMI 29.27 kg/m   Opioid Risk Score:   Fall Risk Score:  `1  Depression screen PHQ 2/9  Depression screen [facility] 2/9 05/30/2018 03/30/2018 02/01/2018  Decreased Interest 0 0 2  Down, Depressed, Hopeless 0 0 1  PHQ - 2 Score 0 0 3  Altered sleeping - - 2  Tired, decreased energy - - 2  Change in appetite - - 0  Feeling bad or failure about yourself  - - 2  Trouble concentrating - - 2  Moving slowly or fidgety/restless - - 0  Suicidal thoughts - - 0  PHQ-9 Score - - 11  Difficult doing work/chores - - Very difficult    Review of Systems  Constitutional: Negative.   HENT: Negative.   Eyes: Negative.   Respiratory:  Negative.   Cardiovascular: Negative.   Gastrointestinal: Negative.   Endocrine: Negative.   Genitourinary: Negative.   Musculoskeletal: Positive for arthralgias, back pain and neck pain.       Spasms   Skin: Negative.   Neurological: Positive for weakness and numbness.       Tingling   Psychiatric/Behavioral: Negative.   All other systems reviewed and are negative.      Objective:   Physical Exam Vitals and nursing note reviewed.  Constitutional:      Appearance: Normal appearance.  Neck:     Comments: Cervical Paraspinal Tenderness: C-5-C-6 Cardiovascular:     Rate and Rhythm: Normal rate and regular rhythm.     Pulses: Normal pulses.     Heart sounds: Normal heart sounds.  Pulmonary:     Effort: Pulmonary effort is normal.     Breath sounds: Normal breath sounds.  Musculoskeletal:     Cervical back: Normal range of motion and neck supple.     Comments: Normal Muscle Bulk and Muscle Testing Reveals:  Upper Extremities: Full ROM and Muscle Strength 5/5 Right AC Joint Tenderness Lumbar Paraspinal Tenderness: L-3-L-5 Lower Extremities: Full ROM and Muscle Strength 5/5 Arises from chair with ease Narrow Based  Gait   Skin:    General: Skin is warm and dry.  Neurological:     Mental Status: He is alert and oriented to person, place, and time.  Psychiatric:        Mood and Affect: Mood normal.        Behavior: Behavior normal.           Assessment & Plan:  1.Cervicalgia/Cervical Spondylosis with Radiculopathy: Continue HEP as Tolerated andTopamax:07/26/2019. 2. Lumbar Spondylosis with Radiculopathy. Continue HEP as Tolerated andTopamax.[name] reports Gabapentin and Lyrica ineffective.07/26/2019 3. Injury of right shoulder and right upper arm. Continue HEP as Tolerated. Continue to Monitor.07/26/2019. 4. Chronic Pain Syndrome: Refilled: Oxymorphone10 mg one tablet every 8 hours as needed for pain #90.07/26/2019. We will continue the opioid  monitoring program, this consists of regular clinic visits, examinations, urine drug screen, pill counts as well as use of 07/28/2019 Controlled Substance Reporting system.  [place] of face to face patient care time was spent during this visit. All questions were encouraged and answered.  F/U in 1 month.

## 2019-08-07 ENCOUNTER — Ambulatory Visit: Payer: Medicare HMO | Admitting: Registered Nurse

## 2019-08-09 ENCOUNTER — Ambulatory Visit: Payer: Medicare HMO | Admitting: Registered Nurse

## 2019-08-24 ENCOUNTER — Telehealth: Payer: Self-pay | Admitting: Registered Nurse

## 2019-08-24 NOTE — Telephone Encounter (Signed)
PMP was Reviewed: Last Oxymorphone was filled on 08/04/2019. Last Fill date was on 08/04/2019. Script not e-scribed today. Chart Reviewed due to possible power outages.

## 2019-08-25 ENCOUNTER — Encounter: Payer: Self-pay | Admitting: Registered Nurse

## 2019-08-25 ENCOUNTER — Other Ambulatory Visit: Payer: Self-pay

## 2019-08-25 ENCOUNTER — Encounter: Payer: Medicare HMO | Attending: Physical Medicine & Rehabilitation | Admitting: Registered Nurse

## 2019-08-25 VITALS — BP 147/85 | HR 74 | Temp 97.7°F | Ht 70.0 in | Wt 206.0 lb

## 2019-08-25 DIAGNOSIS — M4722 Other spondylosis with radiculopathy, cervical region: Secondary | ICD-10-CM | POA: Diagnosis not present

## 2019-08-25 DIAGNOSIS — M1711 Unilateral primary osteoarthritis, right knee: Secondary | ICD-10-CM | POA: Insufficient documentation

## 2019-08-25 DIAGNOSIS — M5136 Other intervertebral disc degeneration, lumbar region: Secondary | ICD-10-CM | POA: Insufficient documentation

## 2019-08-25 DIAGNOSIS — S4491XS Injury of unspecified nerve at shoulder and upper arm level, right arm, sequela: Secondary | ICD-10-CM | POA: Diagnosis not present

## 2019-08-25 DIAGNOSIS — Z5181 Encounter for therapeutic drug level monitoring: Secondary | ICD-10-CM | POA: Insufficient documentation

## 2019-08-25 DIAGNOSIS — M4726 Other spondylosis with radiculopathy, lumbar region: Secondary | ICD-10-CM | POA: Diagnosis present

## 2019-08-25 DIAGNOSIS — M542 Cervicalgia: Secondary | ICD-10-CM

## 2019-08-25 DIAGNOSIS — M47812 Spondylosis without myelopathy or radiculopathy, cervical region: Secondary | ICD-10-CM | POA: Insufficient documentation

## 2019-08-25 DIAGNOSIS — M5412 Radiculopathy, cervical region: Secondary | ICD-10-CM | POA: Diagnosis not present

## 2019-08-25 DIAGNOSIS — Z79891 Long term (current) use of opiate analgesic: Secondary | ICD-10-CM

## 2019-08-25 DIAGNOSIS — Z87891 Personal history of nicotine dependence: Secondary | ICD-10-CM | POA: Insufficient documentation

## 2019-08-25 DIAGNOSIS — M19042 Primary osteoarthritis, left hand: Secondary | ICD-10-CM

## 2019-08-25 DIAGNOSIS — G894 Chronic pain syndrome: Secondary | ICD-10-CM | POA: Diagnosis present

## 2019-08-25 DIAGNOSIS — M19041 Primary osteoarthritis, right hand: Secondary | ICD-10-CM

## 2019-08-25 MED ORDER — OXYMORPHONE HCL 10 MG PO TABS: 10.0000 mg | ORAL_TABLET | Freq: Three times a day (TID) | ORAL | 0 refills | Status: DC | PRN

## 2019-08-25 MED ORDER — TOPIRAMATE 50 MG PO TABS: 50.0000 mg | ORAL_TABLET | Freq: Every day | ORAL | 2 refills | Status: DC

## 2019-08-25 NOTE — Progress Notes (Signed)
Subjective:    Patient ID: Erik Taylor, male    DOB: 10/22/1955, 64 y.o.   MRN: [id number]  HPI: [name] is a 64 y.o. male who returns for follow up appointment for chronic pain and medication refill. He states his  pain is located in his neck radiating into his right shoulder and lower back pain. He rates his pain 8. His  current exercise regime is walking and performing stretching exercises.  Ms. Farias Morphine equivalent is 90.00 MME.  Last UDS was Performed on 05/08/2019, it was consistent.    Pain Inventory Average Pain 7 Pain Right Now 8 My pain is sharp, burning, dull, stabbing, tingling and aching  In the last 24 hours, has pain interfered with the following? General activity 7 Relation with others 4 Enjoyment of life 7 What TIME of day is your pain at its worst? daytime Sleep (in general) Good  Pain is worse with: walking, bending, sitting, inactivity, standing and some activites Pain improves with: rest, heat/ice and medication Relief from Meds: 7  Mobility walk without assistance ability to climb steps?  yes do you drive?  yes  Function disabled: date disabled 15 I need assistance with the following:  dressing, meal prep, household duties and shopping  Neuro/Psych weakness numbness spasms  Prior Studies Any changes since last visit?  no  Physicians involved in your care Any changes since last visit?  no Primary care Novella Rob, NP   No family history on file. Social History   Socioeconomic History  . Marital status: Married    Spouse name: Not on file  . Number of children: Not on file  . Years of education: Not on file  . Highest education level: Not on file  Occupational History  . Not on file  Tobacco Use  . Smoking status: Former Smoker    Types: Cigarettes    Quit date: 02/01/1994    Years since quitting: 25.5  . Smokeless tobacco: Never Used  Substance and Sexual Activity  . Alcohol use: Not on file  . Drug use: Not  on file  . Sexual activity: Not on file  Other Topics Concern  . Not on file  Social History Narrative  . Not on file   Social Determinants of Health   Financial Resource Strain:   . Difficulty of Paying Living Expenses: Not on file  Food Insecurity:   . Worried About Charity fundraiser in the Last Year: Not on file  . Ran Out of Food in the Last Year: Not on file  Transportation Needs:   . Lack of Transportation (Medical): Not on file  . Lack of Transportation (Non-Medical): Not on file  Physical Activity:   . Days of Exercise per Week: Not on file  . Minutes of Exercise per Session: Not on file  Stress:   . Feeling of Stress : Not on file  Social Connections:   . Frequency of Communication with Friends and Family: Not on file  . Frequency of Social Gatherings with Friends and Family: Not on file  . Attends Religious Services: Not on file  . Active Member of Clubs or Organizations: Not on file  . Attends Archivist Meetings: Not on file  . Marital Status: Not on file   No past surgical history on file. No past medical history on file. There were no vitals taken for this visit.  Opioid Risk Score:   Fall Risk Score:  `1  Depression screen PHQ  2/9  Depression screen [facility] 2/9 05/30/2018 03/30/2018 02/01/2018  Decreased Interest 0 0 2  Down, Depressed, Hopeless 0 0 1  PHQ - 2 Score 0 0 3  Altered sleeping - - 2  Tired, decreased energy - - 2  Change in appetite - - 0  Feeling bad or failure about yourself  - - 2  Trouble concentrating - - 2  Moving slowly or fidgety/restless - - 0  Suicidal thoughts - - 0  PHQ-9 Score - - 11  Difficult doing work/chores - - Very difficult   Review of Systems  Neurological: Positive for weakness and numbness.  All other systems reviewed and are negative.      Objective:   Physical Exam Vitals and nursing note reviewed.  Constitutional:      Appearance: Normal appearance.  Cardiovascular:     Rate and Rhythm: Normal  rate and regular rhythm.     Pulses: Normal pulses.     Heart sounds: Normal heart sounds.  Pulmonary:     Effort: Pulmonary effort is normal.     Breath sounds: Normal breath sounds.  Musculoskeletal:     Cervical back: Normal range of motion and neck supple.     Comments: Normal Muscle Bulk and Muscle Testing Reveals:  Upper Extremities: Full ROM and Muscle Strength on Right 4/5 and Left 5/5 Right AC Joint Tenderness Lumbar Paraspinal Tenderness: L-3-L-5 Lower Extremities: Full ROM and Muscle Strength 5/5 Arises from chair with ease Narrow Based  Gait   Skin:    General: Skin is warm and dry.  Neurological:     Mental Status: He is alert and oriented to person, place, and time.  Psychiatric:        Mood and Affect: Mood normal.        Behavior: Behavior normal.           Assessment & Plan:  1.Cervicalgia/Cervical Spondylosis with Radiculopathy: Continue HEP as Tolerated andTopamax:08/25/2019. 2. Lumbar Spondylosis with Radiculopathy. Continue HEP as Tolerated andTopamax.Mr. Brosh reports Gabapentin and Lyrica ineffective.08/25/2019 3. Injury of right shoulder and right upper arm. Continue HEP as Tolerated. Continue to Monitor.08/25/2019. 4. Chronic Pain Syndrome: Refilled: Oxymorphone10 mg one tablet every 8 hours as needed for pain #90.08/25/2019. We will continue the opioid monitoring program, this consists of regular clinic visits, examinations, urine drug screen, pill counts as well as use of [place] Controlled Substance Reporting system.  of face to face patient care time was spent during this visit. All questions were encouraged and answered.  F/U in 1 month.

## 2019-09-29 ENCOUNTER — Other Ambulatory Visit: Payer: Self-pay

## 2019-09-29 ENCOUNTER — Encounter: Payer: Medicare HMO | Attending: Physical Medicine & Rehabilitation | Admitting: Registered Nurse

## 2019-09-29 ENCOUNTER — Encounter: Payer: Self-pay | Admitting: Registered Nurse

## 2019-09-29 VITALS — BP 138/82 | HR 70 | Temp 97.5°F | Ht 70.0 in | Wt 206.8 lb

## 2019-09-29 DIAGNOSIS — Z79891 Long term (current) use of opiate analgesic: Secondary | ICD-10-CM

## 2019-09-29 DIAGNOSIS — M4726 Other spondylosis with radiculopathy, lumbar region: Secondary | ICD-10-CM | POA: Diagnosis present

## 2019-09-29 DIAGNOSIS — M542 Cervicalgia: Secondary | ICD-10-CM | POA: Diagnosis not present

## 2019-09-29 DIAGNOSIS — S4491XS Injury of unspecified nerve at shoulder and upper arm level, right arm, sequela: Secondary | ICD-10-CM

## 2019-09-29 DIAGNOSIS — M19042 Primary osteoarthritis, left hand: Secondary | ICD-10-CM

## 2019-09-29 DIAGNOSIS — Z5181 Encounter for therapeutic drug level monitoring: Secondary | ICD-10-CM

## 2019-09-29 DIAGNOSIS — M4722 Other spondylosis with radiculopathy, cervical region: Secondary | ICD-10-CM | POA: Diagnosis present

## 2019-09-29 DIAGNOSIS — M1711 Unilateral primary osteoarthritis, right knee: Secondary | ICD-10-CM | POA: Insufficient documentation

## 2019-09-29 DIAGNOSIS — G894 Chronic pain syndrome: Secondary | ICD-10-CM

## 2019-09-29 DIAGNOSIS — M545 Low back pain, unspecified: Secondary | ICD-10-CM

## 2019-09-29 DIAGNOSIS — M19041 Primary osteoarthritis, right hand: Secondary | ICD-10-CM

## 2019-09-29 DIAGNOSIS — M5412 Radiculopathy, cervical region: Secondary | ICD-10-CM | POA: Diagnosis not present

## 2019-09-29 DIAGNOSIS — G8929 Other chronic pain: Secondary | ICD-10-CM

## 2019-09-29 DIAGNOSIS — M47812 Spondylosis without myelopathy or radiculopathy, cervical region: Secondary | ICD-10-CM | POA: Diagnosis not present

## 2019-09-29 DIAGNOSIS — Z87891 Personal history of nicotine dependence: Secondary | ICD-10-CM | POA: Insufficient documentation

## 2019-09-29 DIAGNOSIS — M5136 Other intervertebral disc degeneration, lumbar region: Secondary | ICD-10-CM | POA: Insufficient documentation

## 2019-09-29 MED ORDER — OXYMORPHONE HCL 10 MG PO TABS: 10.0000 mg | ORAL_TABLET | Freq: Three times a day (TID) | ORAL | 0 refills | Status: DC | PRN

## 2019-09-29 NOTE — Progress Notes (Signed)
Subjective:    Patient ID: Erik Taylor, male    DOB: 1955/12/19, 64 y.o.   MRN: [id number]  HPI: Erik Taylor is a 64 y.o. male who returns for follow up appointment for chronic pain and medication refill. He states his pain is located in his neck radiating into his right shoulder and lower back pain. Also reports bilateral hand pain L>R. He rates his pain 7. His current exercise regime is walking and performing stretching exercises.  Ms. Andrada Morphine equivalent is 90.00MME.  Last UDS was Performed on 05/08/2019, it was consistent.   Pain Inventory Average Pain 6 Pain Right Now 7 My pain is sharp, stabbing and aching  In the last 24 hours, has pain interfered with the following? General activity 5 Relation with others 6 Enjoyment of life 4 What TIME of day is your pain at its worst? daytime Sleep (in general) Good  Pain is worse with: walking, bending, sitting, inactivity and standing Pain improves with: rest and heat/ice Relief from Meds: 6  Mobility walk without assistance how many minutes can you walk?  Marland Kitchen  Function disabled: date disabled 18  Neuro/Psych weakness  Prior Studies none  Physicians involved in your care Primary care .   No family history on file. Social History   Socioeconomic History  . Marital status: Married    Spouse name: Not on file  . Number of children: Not on file  . Years of education: Not on file  . Highest education level: Not on file  Occupational History  . Not on file  Tobacco Use  . Smoking status: Former Smoker    Types: Cigarettes    Quit date: 02/01/1994    Years since quitting: 25.6  . Smokeless tobacco: Never Used  Substance and Sexual Activity  . Alcohol use: Not on file  . Drug use: Not on file  . Sexual activity: Not on file  Other Topics Concern  . Not on file  Social History Narrative  . Not on file   Social Determinants of Health   Financial Resource Strain:   . Difficulty of Paying Living  Expenses:   Food Insecurity:   . Worried About Programme researcher, broadcasting/film/video in the Last Year:   . Barista in the Last Year:   Transportation Needs:   . Freight forwarder (Medical):   Marland Kitchen Lack of Transportation (Non-Medical):   Physical Activity:   . Days of Exercise per Week:   . Minutes of Exercise per Session:   Stress:   . Feeling of Stress :   Social Connections:   . Frequency of Communication with Friends and Family:   . Frequency of Social Gatherings with Friends and Family:   . Attends Religious Services:   . Active Member of Clubs or Organizations:   . Attends Banker Meetings:   Marland Kitchen Marital Status:    No past surgical history on file. No past medical history on file. BP 138/82   Pulse 70   Temp (!) 97.5 F (36.4 C)   Ht 5\' 10"  (1.778 m)   Wt 206 lb 12.8 oz (93.8 kg)   SpO2 99%   BMI 29.67 kg/m   Opioid Risk Score:   Fall Risk Score:  `1  Depression screen PHQ 2/9  Depression screen [facility] 2/9 05/30/2018 03/30/2018 02/01/2018  Decreased Interest 0 0 2  Down, Depressed, Hopeless 0 0 1  PHQ - 2 Score 0 0 3  Altered sleeping - -  2  Tired, decreased energy - - 2  Change in appetite - - 0  Feeling bad or failure about yourself  - - 2  Trouble concentrating - - 2  Moving slowly or fidgety/restless - - 0  Suicidal thoughts - - 0  PHQ-9 Score - - 11  Difficult doing work/chores - - Very difficult     Review of Systems  Cardiovascular: Positive for leg swelling.  All other systems reviewed and are negative.      Objective:   Physical Exam Vitals and nursing note reviewed.  Constitutional:      Appearance: Normal appearance.  Neck:     Comments: Cervical Paraspinal Tenderness: C-5-C-6 Cardiovascular:     Rate and Rhythm: Normal rate and regular rhythm.     Pulses: Normal pulses.     Heart sounds: Normal heart sounds.  Pulmonary:     Effort: Pulmonary effort is normal.     Breath sounds: Normal breath sounds.  Musculoskeletal:      Cervical back: Normal range of motion and neck supple.     Comments: Normal Muscle Bulk and Muscle Testing Reveals:  Upper Extremities: Right: Decreased ROM 90 Degrees  and Muscle Strength 4/5 Left: Full ROM and Muscle Strength 5/5  Bilateral AC Joint Tenderness R>L  Lumbar Paraspinal Tenderness: L-3-L-5 Lower Extremities: Full ROM and Muscle Strength 5/5  Arises from chair with ease Narrow Based  Gait   Skin:    General: Skin is warm and dry.  Neurological:     Mental Status: He is alert and oriented to person, place, and time.  Psychiatric:        Mood and Affect: Mood normal.        Behavior: Behavior normal.           Assessment & Plan:  1.Cervicalgia/Cervical Spondylosis with Radiculopathy: Continue HEP as Tolerated andTopamax:09/29/2019. 2. Lumbar Spondylosis with Radiculopathy. Continue HEP as Tolerated andTopamax.Mr. Francisco reports Gabapentin and Lyrica ineffective.09/29/2019 3. Injury of right shoulder and right upper arm. Continue HEP as Tolerated. Continue to Monitor.09/29/2019. 4. Chronic Pain Syndrome: Refilled: Oxymorphone10 mg one tablet every 8 hours as needed for pain #90.09/29/2019. We will continue the opioid monitoring program, this consists of regular clinic visits, examinations, urine drug screen, pill counts as well as use of [place] Controlled Substance Reporting system.  55minutes of face to face patient care time was spent during this visit. All questions were encouraged and answered.  F/U in 1 month.

## 2019-10-27 ENCOUNTER — Other Ambulatory Visit: Payer: Self-pay

## 2019-10-27 ENCOUNTER — Encounter: Payer: Self-pay | Admitting: Registered Nurse

## 2019-10-27 ENCOUNTER — Encounter: Payer: Medicare HMO | Attending: Physical Medicine & Rehabilitation | Admitting: Registered Nurse

## 2019-10-27 VITALS — BP 151/88 | HR 69 | Temp 97.5°F | Ht 70.0 in | Wt 206.0 lb

## 2019-10-27 DIAGNOSIS — M5136 Other intervertebral disc degeneration, lumbar region: Secondary | ICD-10-CM | POA: Diagnosis not present

## 2019-10-27 DIAGNOSIS — M4726 Other spondylosis with radiculopathy, lumbar region: Secondary | ICD-10-CM | POA: Insufficient documentation

## 2019-10-27 DIAGNOSIS — M545 Low back pain, unspecified: Secondary | ICD-10-CM

## 2019-10-27 DIAGNOSIS — Z87891 Personal history of nicotine dependence: Secondary | ICD-10-CM | POA: Diagnosis not present

## 2019-10-27 DIAGNOSIS — G894 Chronic pain syndrome: Secondary | ICD-10-CM | POA: Diagnosis present

## 2019-10-27 DIAGNOSIS — M47812 Spondylosis without myelopathy or radiculopathy, cervical region: Secondary | ICD-10-CM | POA: Diagnosis not present

## 2019-10-27 DIAGNOSIS — G8929 Other chronic pain: Secondary | ICD-10-CM

## 2019-10-27 DIAGNOSIS — M1711 Unilateral primary osteoarthritis, right knee: Secondary | ICD-10-CM | POA: Insufficient documentation

## 2019-10-27 DIAGNOSIS — Z5181 Encounter for therapeutic drug level monitoring: Secondary | ICD-10-CM | POA: Diagnosis present

## 2019-10-27 DIAGNOSIS — S4491XS Injury of unspecified nerve at shoulder and upper arm level, right arm, sequela: Secondary | ICD-10-CM | POA: Diagnosis not present

## 2019-10-27 DIAGNOSIS — M4722 Other spondylosis with radiculopathy, cervical region: Secondary | ICD-10-CM

## 2019-10-27 DIAGNOSIS — Z79891 Long term (current) use of opiate analgesic: Secondary | ICD-10-CM

## 2019-10-27 DIAGNOSIS — M542 Cervicalgia: Secondary | ICD-10-CM | POA: Diagnosis not present

## 2019-10-27 MED ORDER — OXYMORPHONE HCL 10 MG PO TABS: 10.0000 mg | ORAL_TABLET | Freq: Three times a day (TID) | ORAL | 0 refills | Status: DC | PRN

## 2019-10-27 NOTE — Progress Notes (Signed)
Subjective:    Patient ID: Erik Taylor, male    DOB: August 07, 1955, 64 y.o.   MRN: [id number]  HPI: Erik Taylor is a 64 y.o. male who returns for follow up appointment for chronic pain and medication refill. He states his pain is located in his neck, right arm and lower back pain. He rates his pain 8. His current exercise regime is walking and performing stretching exercises.  Erik Taylor Morphine equivalent is 90.00 MME.  Last UDS was Performed on 05/08/2019, it was consistent.   Pain Inventory Average Pain 7 Pain Right Now 8 My pain is sharp, burning, dull, stabbing, tingling and aching  In the last 24 hours, has pain interfered with the following? General activity 7 Relation with others 4 Enjoyment of life 7 What TIME of day is your pain at its worst? daytime Sleep (in general) Fair  Pain is worse with: walking, bending, sitting, inactivity, standing and some activites Pain improves with: rest, heat/ice and medication Relief from Meds: 6  Mobility walk without assistance ability to climb steps?  yes do you drive?  yes  Function disabled: date disabled 43 I need assistance with the following:  dressing, meal prep, household duties and shopping  Neuro/Psych weakness numbness trouble walking spasms  Prior Studies Any changes since last visit?  no  Physicians involved in your care Any changes since last visit?  no Primary care .   No family history on file. Social History   Socioeconomic History  . Marital status: Married    Spouse name: Not on file  . Number of children: Not on file  . Years of education: Not on file  . Highest education level: Not on file  Occupational History  . Not on file  Tobacco Use  . Smoking status: Former Smoker    Types: Cigarettes    Quit date: 02/01/1994    Years since quitting: 25.7  . Smokeless tobacco: Never Used  Substance and Sexual Activity  . Alcohol use: Not on file  . Drug use: Not on file  . Sexual  activity: Not on file  Other Topics Concern  . Not on file  Social History Narrative  . Not on file   Social Determinants of Health   Financial Resource Strain:   . Difficulty of Paying Living Expenses:   Food Insecurity:   . Worried About Charity fundraiser in the Last Year:   . Arboriculturist in the Last Year:   Transportation Needs:   . Film/video editor (Medical):   Marland Kitchen Lack of Transportation (Non-Medical):   Physical Activity:   . Days of Exercise per Week:   . Minutes of Exercise per Session:   Stress:   . Feeling of Stress :   Social Connections:   . Frequency of Communication with Friends and Family:   . Frequency of Social Gatherings with Friends and Family:   . Attends Religious Services:   . Active Member of Clubs or Organizations:   . Attends Archivist Meetings:   Marland Kitchen Marital Status:    No past surgical history on file. Past Medical History:  Diagnosis Date  . Arthritis   . Diabetes mellitus without complication ([place])   . Neuromuscular disorder ([facility])    BP (!) 151/88   Pulse 69   Temp (!) 97.5 F (36.4 C)   Ht 5\' 10"  (1.778 m)   Wt 206 lb (93.4 kg)   SpO2 98%   BMI 29.56  kg/m   Opioid Risk Score:   Fall Risk Score:  `1  Depression screen PHQ 2/9  Depression screen [facility] 2/9 05/30/2018 03/30/2018 02/01/2018  Decreased Interest 0 0 2  Down, Depressed, Hopeless 0 0 1  PHQ - 2 Score 0 0 3  Altered sleeping - - 2  Tired, decreased energy - - 2  Change in appetite - - 0  Feeling bad or failure about yourself  - - 2  Trouble concentrating - - 2  Moving slowly or fidgety/restless - - 0  Suicidal thoughts - - 0  PHQ-9 Score - - 11  Difficult doing work/chores - - Very difficult    Review of Systems  Musculoskeletal: Positive for gait problem.       Spasms  Neurological: Positive for dizziness, weakness and numbness.  All other systems reviewed and are negative.      Objective:   Physical Exam Vitals and nursing note reviewed.    Constitutional:      Appearance: Normal appearance.  Cardiovascular:     Rate and Rhythm: Normal rate and regular rhythm.     Pulses: Normal pulses.     Heart sounds: Normal heart sounds.  Pulmonary:     Effort: Pulmonary effort is normal.     Breath sounds: Normal breath sounds.  Musculoskeletal:     Cervical back: Normal range of motion and neck supple.     Comments: Normal Muscle Bulk and Muscle Testing Reveals:  Upper Extremities: Right:Decreased ROM  90 Degrees and Muscle Strength 5/5 Left: Full ROM and Muscle Strength 5/5 Bilateral AC Joint Tenderness Lumbar Paraspinal Tenderness: L-4-L-5 Lower Extremities: Full ROM and Muscle Strength 5/5 Arises from chair with ease Narrow Based  Gait   Skin:    General: Skin is warm and dry.  Neurological:     Mental Status: He is alert and oriented to person, place, and time.  Psychiatric:        Mood and Affect: Mood normal.        Behavior: Behavior normal.           Assessment & Plan:  1.Cervicalgia/Cervical Spondylosis with Radiculopathy: Continue HEP as Tolerated andTopamax:09/29/2019. 2. Lumbar Spondylosis with Radiculopathy. Continue HEP as Tolerated andTopamax.[name] reports Gabapentin and Lyrica ineffective.09/29/2019 3. Injury of right shoulder and right upper arm. Continue HEP as Tolerated. Continue to Monitor.09/29/2019. 4. Chronic Pain Syndrome: Refilled: Oxymorphone10 mg one tablet every 8 hours as needed for pain #90.09/29/2019. We will continue the opioid monitoring program, this consists of regular clinic visits, examinations, urine drug screen, pill counts as well as use of [place] Controlled Substance Reporting system.  of face to face patient care time was spent during this visit. All questions were encouraged and answered.  F/U in 1 month.

## 2019-11-24 ENCOUNTER — Other Ambulatory Visit: Payer: Self-pay

## 2019-11-24 ENCOUNTER — Encounter: Payer: Self-pay | Admitting: Registered Nurse

## 2019-11-24 ENCOUNTER — Encounter: Payer: Medicare HMO | Attending: Physical Medicine & Rehabilitation | Admitting: Registered Nurse

## 2019-11-24 VITALS — BP 150/83 | HR 72 | Temp 97.7°F | Ht 70.0 in | Wt 205.2 lb

## 2019-11-24 DIAGNOSIS — Z79891 Long term (current) use of opiate analgesic: Secondary | ICD-10-CM | POA: Diagnosis not present

## 2019-11-24 DIAGNOSIS — M25562 Pain in left knee: Secondary | ICD-10-CM

## 2019-11-24 DIAGNOSIS — M5136 Other intervertebral disc degeneration, lumbar region: Secondary | ICD-10-CM | POA: Insufficient documentation

## 2019-11-24 DIAGNOSIS — G8929 Other chronic pain: Secondary | ICD-10-CM

## 2019-11-24 DIAGNOSIS — S4491XS Injury of unspecified nerve at shoulder and upper arm level, right arm, sequela: Secondary | ICD-10-CM | POA: Diagnosis present

## 2019-11-24 DIAGNOSIS — M4722 Other spondylosis with radiculopathy, cervical region: Secondary | ICD-10-CM | POA: Insufficient documentation

## 2019-11-24 DIAGNOSIS — Z5181 Encounter for therapeutic drug level monitoring: Secondary | ICD-10-CM

## 2019-11-24 DIAGNOSIS — Z87891 Personal history of nicotine dependence: Secondary | ICD-10-CM | POA: Diagnosis not present

## 2019-11-24 DIAGNOSIS — M1711 Unilateral primary osteoarthritis, right knee: Secondary | ICD-10-CM | POA: Diagnosis not present

## 2019-11-24 DIAGNOSIS — M4726 Other spondylosis with radiculopathy, lumbar region: Secondary | ICD-10-CM | POA: Diagnosis present

## 2019-11-24 DIAGNOSIS — M545 Low back pain: Secondary | ICD-10-CM | POA: Diagnosis not present

## 2019-11-24 DIAGNOSIS — M47812 Spondylosis without myelopathy or radiculopathy, cervical region: Secondary | ICD-10-CM | POA: Insufficient documentation

## 2019-11-24 DIAGNOSIS — G894 Chronic pain syndrome: Secondary | ICD-10-CM | POA: Diagnosis present

## 2019-11-24 MED ORDER — OXYMORPHONE HCL 10 MG PO TABS: 10.0000 mg | ORAL_TABLET | Freq: Three times a day (TID) | ORAL | 0 refills | Status: DC | PRN
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
## 2019-11-29 LAB — TOXASSURE SELECT,+ANTIDEPR,UR

## 2019-12-06 ENCOUNTER — Telehealth: Payer: Self-pay | Admitting: *Deleted

## 2019-12-06 NOTE — Telephone Encounter (Signed)
Urine drug screen for this encounter is consistent for prescribed medication 

## 2019-12-24 ENCOUNTER — Emergency Department (HOSPITAL_COMMUNITY): Payer: Medicare HMO

## 2019-12-24 ENCOUNTER — Encounter (HOSPITAL_COMMUNITY): Payer: Self-pay

## 2019-12-24 ENCOUNTER — Other Ambulatory Visit: Payer: Self-pay

## 2019-12-24 ENCOUNTER — Emergency Department (HOSPITAL_COMMUNITY)
Admission: EM | Admit: 2019-12-24 | Discharge: 2019-12-24 | Disposition: A | Discharge status: Home / Self Care | Payer: Medicare HMO | Attending: Emergency Medicine | Admitting: Emergency Medicine

## 2019-12-24 DIAGNOSIS — Z7984 Long term (current) use of oral hypoglycemic drugs: Secondary | ICD-10-CM | POA: Diagnosis not present

## 2019-12-24 DIAGNOSIS — Z87891 Personal history of nicotine dependence: Secondary | ICD-10-CM | POA: Insufficient documentation

## 2019-12-24 DIAGNOSIS — R1012 Left upper quadrant pain: Secondary | ICD-10-CM | POA: Insufficient documentation

## 2019-12-24 DIAGNOSIS — E119 Type 2 diabetes mellitus without complications: Secondary | ICD-10-CM | POA: Insufficient documentation

## 2019-12-24 DIAGNOSIS — Z79899 Other long term (current) drug therapy: Secondary | ICD-10-CM | POA: Insufficient documentation

## 2019-12-24 DIAGNOSIS — R1032 Left lower quadrant pain: Secondary | ICD-10-CM | POA: Insufficient documentation

## 2019-12-24 DIAGNOSIS — Y9389 Activity, other specified: Secondary | ICD-10-CM | POA: Insufficient documentation

## 2019-12-24 DIAGNOSIS — M791 Myalgia, unspecified site: Secondary | ICD-10-CM | POA: Diagnosis not present

## 2019-12-24 DIAGNOSIS — Y92410 Unspecified street and highway as the place of occurrence of the external cause: Secondary | ICD-10-CM | POA: Diagnosis not present

## 2019-12-24 DIAGNOSIS — S0990XA Unspecified injury of head, initial encounter: Secondary | ICD-10-CM

## 2019-12-24 DIAGNOSIS — Y999 Unspecified external cause status: Secondary | ICD-10-CM | POA: Insufficient documentation

## 2019-12-24 DIAGNOSIS — M7918 Myalgia, other site: Secondary | ICD-10-CM

## 2019-12-24 LAB — CBC
HCT: 39.2 % (ref 39.0–52.0)
Hemoglobin: 13.5 g/dL (ref 13.0–17.0)
MCH: 30.1 pg (ref 26.0–34.0)
MCHC: 34.4 g/dL (ref 30.0–36.0)
MCV: 87.3 fL (ref 80.0–100.0)
Platelets: 201 10*3/uL (ref 150–400)
RBC: 4.49 MIL/uL (ref 4.22–5.81)
RDW: 12.8 % (ref 11.5–15.5)
WBC: 13.1 10*3/uL — ABNORMAL HIGH (ref 4.0–10.5)
nRBC: 0 % (ref 0.0–0.2)

## 2019-12-24 LAB — COMPREHENSIVE METABOLIC PANEL
ALT: 26 U/L (ref 0–44)
AST: 28 U/L (ref 15–41)
Albumin: 3.8 g/dL (ref 3.5–5.0)
Alkaline Phosphatase: 66 U/L (ref 38–126)
Anion gap: 8 (ref 5–15)
BUN: 14 mg/dL (ref 8–23)
CO2: 24 mmol/L (ref 22–32)
Calcium: 8.9 mg/dL (ref 8.9–10.3)
Chloride: 101 mmol/L (ref 98–111)
Creatinine, Ser: 0.68 mg/dL (ref 0.61–1.24)
GFR calc Af Amer: >60 mL/min (ref >60)
GFR calc non Af Amer: >60 mL/min (ref >60)
Glucose, Bld: 139 mg/dL — ABNORMAL HIGH (ref 70–99)
Potassium: 4.1 mmol/L (ref 3.5–5.1)
Sodium: 133 mmol/L — ABNORMAL LOW (ref 135–145)
Total Bilirubin: 0.9 mg/dL (ref 0.3–1.2)
Total Protein: 7.2 g/dL (ref 6.5–8.1)

## 2019-12-24 MED ORDER — SODIUM CHLORIDE 0.9 % IV BOLUS
500.0000 mL | Freq: Once | INTRAVENOUS | Status: AC
  Administered 2019-12-24: 500 mL via INTRAVENOUS

## 2019-12-24 MED ORDER — NAPROXEN 375 MG PO TABS
375.0000 mg | ORAL_TABLET | Freq: Two times a day (BID) | ORAL | 0 refills | Status: AC
Start: 2019-12-24 — End: ?

## 2019-12-24 MED ORDER — IOHEXOL 300 MG/ML  SOLN
100.0000 mL | Freq: Once | INTRAMUSCULAR | Status: AC | PRN
  Administered 2019-12-24: 100 mL via INTRAVENOUS

## 2019-12-24 MED ORDER — METHOCARBAMOL 750 MG PO TABS
750.0000 mg | ORAL_TABLET | Freq: Every evening | ORAL | 0 refills | Status: AC | PRN
Start: 2019-12-24 — End: 2019-12-29

## 2019-12-24 MED ORDER — FENTANYL CITRATE (PF) 100 MCG/2ML IJ SOLN
50.0000 ug | Freq: Once | INTRAMUSCULAR | Status: AC
  Administered 2019-12-24: 50 ug via INTRAVENOUS
  Filled 2019-12-24: qty 2
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
## 2019-12-24 NOTE — Discharge Instructions (Addendum)
You may alternate taking Tylenol and Naproxen as needed for pain control. You may take Naproxen twice daily as directed on your discharge paperwork and you may take  [phone] mg of Tylenol every 6 hours. Do not exceed 4000 mg of Tylenol daily as this can lead to liver damage. Also, make sure to take Naproxen with meals as it can cause an upset stomach. Do not take other NSAIDs while taking Naproxen such as (Aleve, Ibuprofen, Aspirin, Celebrex, etc) and do not take more than the prescribed dose as this can lead to ulcers and bleeding in your GI tract. You may use warm and cold compresses to help with your symptoms.   You were given a prescription for Robaxin which is a muscle relaxer.  You should not drive, work, or operate machinery while taking this medication as it can make you very drowsy.    HEAD INJURY If any of the following occur notify your physician or go to the Hospital Emergency Department:  Increased drowsiness, stupor or loss of consciousness  Restlessness or convulsions (fits)  Paralysis in arms or legs  Temperature above 100 F  Vomiting  Severe headache  Blood or clear fluid dripping from the nose or ears  Stiffness of the neck  Dizziness or blurred vision  Pulsating pain in the eye  Unequal pupils of eye  Personality changes  Any other unusual symptoms  PRECAUTIONS  Do not take tranquilizers, sedatives, narcotics or alcohol  Avoid aspirin. Use only acetaminophen (e.g. Tylenol) or ibuprofen (e.g. Advil) for relief of pain. Follow directions on the bottle for dosage.  Use ice packs for comfort  Getting plenty of rest and sleep helps the brain to heal. Do not try to do too much too fast. As you start to feel better, you can slowly and gradually return to your usual routine.  Avoid activities that are physically demanding (e.g., sports, heavy housecleaning, exercising) or require a lot of thinking or concentration (e.g., working on the computer, playing video games).  Ask  your health care professional when you can safely drive a car, ride a bike, or operate heavy equipment.  MEDICATIONS Use medications only as directed by your physician  Follow up with your primary care provider within 5-7 days for re-evaluation.   Please return to the ER sooner if you have any new or worsening symptoms.
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
## 2019-12-25 ENCOUNTER — Telehealth: Payer: Self-pay | Admitting: Registered Nurse

## 2019-12-25 DIAGNOSIS — S4491XS Injury of unspecified nerve at shoulder and upper arm level, right arm, sequela: Secondary | ICD-10-CM

## 2019-12-25 MED ORDER — OXYMORPHONE HCL 10 MG PO TABS: 10.0000 mg | ORAL_TABLET | Freq: Three times a day (TID) | ORAL | 0 refills | Status: DC | PRN

## 2019-12-25 NOTE — Telephone Encounter (Signed)
Pt called asking for refill on Oxymorphone HCL 10 mg. He thought he sent a mychart message Friday and today.
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
## 2019-12-29 ENCOUNTER — Encounter: Payer: Medicare HMO | Admitting: Registered Nurse

## 2020-01-03 ENCOUNTER — Encounter: Payer: Medicare HMO | Attending: Physical Medicine & Rehabilitation | Admitting: Registered Nurse

## 2020-01-03 ENCOUNTER — Other Ambulatory Visit: Payer: Self-pay

## 2020-01-03 ENCOUNTER — Encounter: Payer: Self-pay | Admitting: Registered Nurse

## 2020-01-03 DIAGNOSIS — M1711 Unilateral primary osteoarthritis, right knee: Secondary | ICD-10-CM | POA: Insufficient documentation

## 2020-01-03 DIAGNOSIS — M542 Cervicalgia: Secondary | ICD-10-CM

## 2020-01-03 DIAGNOSIS — Z79891 Long term (current) use of opiate analgesic: Secondary | ICD-10-CM | POA: Insufficient documentation

## 2020-01-03 DIAGNOSIS — M5412 Radiculopathy, cervical region: Secondary | ICD-10-CM

## 2020-01-03 DIAGNOSIS — M4722 Other spondylosis with radiculopathy, cervical region: Secondary | ICD-10-CM

## 2020-01-03 DIAGNOSIS — M5136 Other intervertebral disc degeneration, lumbar region: Secondary | ICD-10-CM | POA: Insufficient documentation

## 2020-01-03 DIAGNOSIS — G894 Chronic pain syndrome: Secondary | ICD-10-CM

## 2020-01-03 DIAGNOSIS — S4491XS Injury of unspecified nerve at shoulder and upper arm level, right arm, sequela: Secondary | ICD-10-CM | POA: Insufficient documentation

## 2020-01-03 DIAGNOSIS — Z87891 Personal history of nicotine dependence: Secondary | ICD-10-CM | POA: Insufficient documentation

## 2020-01-03 DIAGNOSIS — M5416 Radiculopathy, lumbar region: Secondary | ICD-10-CM

## 2020-01-03 DIAGNOSIS — M47812 Spondylosis without myelopathy or radiculopathy, cervical region: Secondary | ICD-10-CM | POA: Insufficient documentation

## 2020-01-03 DIAGNOSIS — Z5181 Encounter for therapeutic drug level monitoring: Secondary | ICD-10-CM | POA: Insufficient documentation

## 2020-01-03 DIAGNOSIS — M4726 Other spondylosis with radiculopathy, lumbar region: Secondary | ICD-10-CM | POA: Insufficient documentation
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
## 2020-01-18 ENCOUNTER — Encounter: Payer: Self-pay | Admitting: Registered Nurse

## 2020-01-18 ENCOUNTER — Encounter: Payer: Medicare HMO | Attending: Physical Medicine & Rehabilitation | Admitting: Registered Nurse

## 2020-01-18 ENCOUNTER — Other Ambulatory Visit: Payer: Self-pay

## 2020-01-18 VITALS — BP 124/79 | HR 78 | Temp 98.7°F | Ht 70.0 in | Wt 206.0 lb

## 2020-01-18 DIAGNOSIS — S4491XS Injury of unspecified nerve at shoulder and upper arm level, right arm, sequela: Secondary | ICD-10-CM | POA: Insufficient documentation

## 2020-01-18 DIAGNOSIS — M5136 Other intervertebral disc degeneration, lumbar region: Secondary | ICD-10-CM | POA: Insufficient documentation

## 2020-01-18 DIAGNOSIS — M5412 Radiculopathy, cervical region: Secondary | ICD-10-CM | POA: Diagnosis not present

## 2020-01-18 DIAGNOSIS — M4722 Other spondylosis with radiculopathy, cervical region: Secondary | ICD-10-CM | POA: Insufficient documentation

## 2020-01-18 DIAGNOSIS — M4726 Other spondylosis with radiculopathy, lumbar region: Secondary | ICD-10-CM | POA: Diagnosis present

## 2020-01-18 DIAGNOSIS — M546 Pain in thoracic spine: Secondary | ICD-10-CM

## 2020-01-18 DIAGNOSIS — M542 Cervicalgia: Secondary | ICD-10-CM

## 2020-01-18 DIAGNOSIS — Z5181 Encounter for therapeutic drug level monitoring: Secondary | ICD-10-CM

## 2020-01-18 DIAGNOSIS — G894 Chronic pain syndrome: Secondary | ICD-10-CM | POA: Diagnosis present

## 2020-01-18 DIAGNOSIS — Z87891 Personal history of nicotine dependence: Secondary | ICD-10-CM | POA: Insufficient documentation

## 2020-01-18 DIAGNOSIS — M1711 Unilateral primary osteoarthritis, right knee: Secondary | ICD-10-CM | POA: Diagnosis not present

## 2020-01-18 DIAGNOSIS — M5416 Radiculopathy, lumbar region: Secondary | ICD-10-CM | POA: Diagnosis not present

## 2020-01-18 DIAGNOSIS — M47812 Spondylosis without myelopathy or radiculopathy, cervical region: Secondary | ICD-10-CM | POA: Insufficient documentation

## 2020-01-18 DIAGNOSIS — Z79891 Long term (current) use of opiate analgesic: Secondary | ICD-10-CM | POA: Insufficient documentation

## 2020-01-18 MED ORDER — OXYMORPHONE HCL 10 MG PO TABS: 10.0000 mg | ORAL_TABLET | Freq: Three times a day (TID) | ORAL | 0 refills | Status: DC | PRN
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
## 2020-01-19 ENCOUNTER — Ambulatory Visit: Payer: Medicare HMO | Admitting: Registered Nurse

## 2020-01-26 ENCOUNTER — Ambulatory Visit: Payer: Medicare HMO | Admitting: Registered Nurse

## 2020-02-16 ENCOUNTER — Encounter: Payer: Medicare HMO | Attending: Physical Medicine & Rehabilitation | Admitting: Registered Nurse

## 2020-02-16 ENCOUNTER — Other Ambulatory Visit: Payer: Self-pay

## 2020-02-16 VITALS — BP 147/92 | HR 73 | Temp 98.3°F | Ht 70.0 in | Wt 198.0 lb

## 2020-02-16 DIAGNOSIS — M4722 Other spondylosis with radiculopathy, cervical region: Secondary | ICD-10-CM | POA: Insufficient documentation

## 2020-02-16 DIAGNOSIS — M4726 Other spondylosis with radiculopathy, lumbar region: Secondary | ICD-10-CM | POA: Diagnosis present

## 2020-02-16 DIAGNOSIS — M5412 Radiculopathy, cervical region: Secondary | ICD-10-CM | POA: Diagnosis not present

## 2020-02-16 DIAGNOSIS — M545 Low back pain, unspecified: Secondary | ICD-10-CM

## 2020-02-16 DIAGNOSIS — S4491XS Injury of unspecified nerve at shoulder and upper arm level, right arm, sequela: Secondary | ICD-10-CM | POA: Diagnosis present

## 2020-02-16 DIAGNOSIS — G8929 Other chronic pain: Secondary | ICD-10-CM

## 2020-02-16 DIAGNOSIS — M542 Cervicalgia: Secondary | ICD-10-CM | POA: Diagnosis not present

## 2020-02-16 DIAGNOSIS — Z5181 Encounter for therapeutic drug level monitoring: Secondary | ICD-10-CM | POA: Insufficient documentation

## 2020-02-16 DIAGNOSIS — Z87891 Personal history of nicotine dependence: Secondary | ICD-10-CM | POA: Diagnosis not present

## 2020-02-16 DIAGNOSIS — M1711 Unilateral primary osteoarthritis, right knee: Secondary | ICD-10-CM | POA: Insufficient documentation

## 2020-02-16 DIAGNOSIS — M5136 Other intervertebral disc degeneration, lumbar region: Secondary | ICD-10-CM | POA: Diagnosis not present

## 2020-02-16 DIAGNOSIS — Z79891 Long term (current) use of opiate analgesic: Secondary | ICD-10-CM | POA: Insufficient documentation

## 2020-02-16 DIAGNOSIS — M47812 Spondylosis without myelopathy or radiculopathy, cervical region: Secondary | ICD-10-CM | POA: Diagnosis not present

## 2020-02-16 DIAGNOSIS — G894 Chronic pain syndrome: Secondary | ICD-10-CM | POA: Diagnosis present

## 2020-02-16 MED ORDER — OXYMORPHONE HCL 10 MG PO TABS: 10.0000 mg | ORAL_TABLET | Freq: Three times a day (TID) | ORAL | 0 refills | Status: DC | PRN
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
## 2020-02-19 ENCOUNTER — Encounter: Payer: Self-pay | Admitting: Registered Nurse

## 2020-03-22 ENCOUNTER — Encounter: Payer: Medicare HMO | Attending: Physical Medicine & Rehabilitation | Admitting: Registered Nurse

## 2020-03-22 ENCOUNTER — Encounter: Payer: Self-pay | Admitting: Registered Nurse

## 2020-03-22 ENCOUNTER — Other Ambulatory Visit: Payer: Self-pay

## 2020-03-22 VITALS — BP 152/84 | HR 73 | Temp 98.6°F | Ht 70.0 in | Wt 205.0 lb

## 2020-03-22 DIAGNOSIS — M5136 Other intervertebral disc degeneration, lumbar region: Secondary | ICD-10-CM | POA: Diagnosis not present

## 2020-03-22 DIAGNOSIS — G894 Chronic pain syndrome: Secondary | ICD-10-CM | POA: Diagnosis not present

## 2020-03-22 DIAGNOSIS — M4722 Other spondylosis with radiculopathy, cervical region: Secondary | ICD-10-CM | POA: Insufficient documentation

## 2020-03-22 DIAGNOSIS — M542 Cervicalgia: Secondary | ICD-10-CM | POA: Diagnosis not present

## 2020-03-22 DIAGNOSIS — Z79891 Long term (current) use of opiate analgesic: Secondary | ICD-10-CM | POA: Insufficient documentation

## 2020-03-22 DIAGNOSIS — G8929 Other chronic pain: Secondary | ICD-10-CM

## 2020-03-22 DIAGNOSIS — Z5181 Encounter for therapeutic drug level monitoring: Secondary | ICD-10-CM

## 2020-03-22 DIAGNOSIS — Z87891 Personal history of nicotine dependence: Secondary | ICD-10-CM | POA: Insufficient documentation

## 2020-03-22 DIAGNOSIS — M4726 Other spondylosis with radiculopathy, lumbar region: Secondary | ICD-10-CM | POA: Diagnosis present

## 2020-03-22 DIAGNOSIS — M1711 Unilateral primary osteoarthritis, right knee: Secondary | ICD-10-CM | POA: Diagnosis not present

## 2020-03-22 DIAGNOSIS — S4491XS Injury of unspecified nerve at shoulder and upper arm level, right arm, sequela: Secondary | ICD-10-CM | POA: Diagnosis present

## 2020-03-22 DIAGNOSIS — M545 Low back pain, unspecified: Secondary | ICD-10-CM

## 2020-03-22 DIAGNOSIS — M5412 Radiculopathy, cervical region: Secondary | ICD-10-CM

## 2020-03-22 DIAGNOSIS — M47812 Spondylosis without myelopathy or radiculopathy, cervical region: Secondary | ICD-10-CM | POA: Insufficient documentation

## 2020-03-22 MED ORDER — OXYMORPHONE HCL 10 MG PO TABS: 10.0000 mg | ORAL_TABLET | Freq: Three times a day (TID) | ORAL | 0 refills | Status: DC | PRN
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
## 2020-03-27 LAB — TOXASSURE SELECT,+ANTIDEPR,UR

## 2020-03-28 ENCOUNTER — Telehealth: Payer: Self-pay | Admitting: *Deleted

## 2020-03-28 NOTE — Telephone Encounter (Signed)
Urine drug screen for this encounter is consistent for prescribed medication 

## 2020-04-19 ENCOUNTER — Telehealth: Payer: Self-pay | Admitting: Registered Nurse

## 2020-04-19 ENCOUNTER — Encounter: Payer: Self-pay | Admitting: Registered Nurse

## 2020-04-19 ENCOUNTER — Encounter: Payer: Medicare HMO | Attending: Physical Medicine & Rehabilitation | Admitting: Registered Nurse

## 2020-04-19 ENCOUNTER — Other Ambulatory Visit: Payer: Self-pay

## 2020-04-19 VITALS — BP 153/92 | HR 71 | Temp 98.1°F | Ht 70.0 in | Wt 204.0 lb

## 2020-04-19 DIAGNOSIS — S4491XS Injury of unspecified nerve at shoulder and upper arm level, right arm, sequela: Secondary | ICD-10-CM

## 2020-04-19 DIAGNOSIS — M5412 Radiculopathy, cervical region: Secondary | ICD-10-CM | POA: Diagnosis present

## 2020-04-19 DIAGNOSIS — Z5181 Encounter for therapeutic drug level monitoring: Secondary | ICD-10-CM | POA: Diagnosis present

## 2020-04-19 DIAGNOSIS — G894 Chronic pain syndrome: Secondary | ICD-10-CM | POA: Diagnosis present

## 2020-04-19 DIAGNOSIS — M542 Cervicalgia: Secondary | ICD-10-CM

## 2020-04-19 DIAGNOSIS — M4722 Other spondylosis with radiculopathy, cervical region: Secondary | ICD-10-CM

## 2020-04-19 DIAGNOSIS — Z79891 Long term (current) use of opiate analgesic: Secondary | ICD-10-CM | POA: Diagnosis present

## 2020-04-19 DIAGNOSIS — M545 Low back pain, unspecified: Secondary | ICD-10-CM

## 2020-04-19 DIAGNOSIS — G8929 Other chronic pain: Secondary | ICD-10-CM | POA: Insufficient documentation

## 2020-04-19 MED ORDER — OXYMORPHONE HCL 10 MG PO TABS: 10.0000 mg | ORAL_TABLET | Freq: Three times a day (TID) | ORAL | 0 refills | Status: DC | PRN

## 2020-04-19 NOTE — Progress Notes (Signed)
Subjective:    Patient ID: Erik Taylor, male    DOB: 1955/07/15, 64 y.o.   MRN: [id number]  HPI: Erik Taylor is a 64 y.o. male who returns for follow up appointment for chronic pain and medication refill. He states his pain is located in his neck radiating into his right shoulder and lower back pain. He rates his pain 7. His current exercise regime is walking and performing stretching exercises.  [name] Morphine equivalent is 90.00 MME.    Last UDS was Performed on 03/22/2020, it was consistent.    Pain Inventory Average Pain 7 Pain Right Now 7 My pain is sharp, burning, dull, stabbing, tingling and aching  In the last 24 hours, has pain interfered with the following? General activity 7 Relation with others 4 Enjoyment of life 7 What TIME of day is your pain at its worst? daytime Sleep (in general) Fair  Pain is worse with: walking, bending, sitting, inactivity, standing and some activites Pain improves with: rest, heat/ice and medication Relief from Meds: 6  Family History  Problem Relation Age of Onset  . Stroke Mother   . Heart disease Father   . Heart disease Sister   . Kidney disease Brother    Social History   Socioeconomic History  . Marital status: Married    Spouse name: Not on file  . Number of children: Not on file  . Years of education: Not on file  . Highest education level: Not on file  Occupational History  . Not on file  Tobacco Use  . Smoking status: Former Smoker    Types: Cigarettes    Quit date: 02/01/1994    Years since quitting: 26.2  . Smokeless tobacco: Never Used  Substance and Sexual Activity  . Alcohol use: Never  . Drug use: Never  . Sexual activity: Not on file  Other Topics Concern  . Not on file  Social History Narrative  . Not on file   Social Determinants of Health   Financial Resource Strain:   . Difficulty of Paying Living Expenses: Not on file  Food Insecurity:   . Worried About Programme researcher, broadcasting/film/video in  the Last Year: Not on file  . Ran Out of Food in the Last Year: Not on file  Transportation Needs:   . Lack of Transportation (Medical): Not on file  . Lack of Transportation (Non-Medical): Not on file  Physical Activity:   . Days of Exercise per Week: Not on file  . Minutes of Exercise per Session: Not on file  Stress:   . Feeling of Stress : Not on file  Social Connections:   . Frequency of Communication with Friends and Family: Not on file  . Frequency of Social Gatherings with Friends and Family: Not on file  . Attends Religious Services: Not on file  . Active Member of Clubs or Organizations: Not on file  . Attends Banker Meetings: Not on file  . Marital Status: Not on file   Past Surgical History:  Procedure Laterality Date  . arm nere repair     nerve repair  . TONSILLECTOMY     Past Surgical History:  Procedure Laterality Date  . arm nere repair     nerve repair  . TONSILLECTOMY     Past Medical History:  Diagnosis Date  . Arthritis   . Diabetes mellitus without complication (HCC)   . Neuromuscular disorder (HCC)    BP (!) 153/92  Pulse 71   Temp 98.1 F (36.7 C)   Ht 5\' 10"  (1.778 m)   Wt 204 lb (92.5 kg)   SpO2 96%   BMI 29.27 kg/m   Opioid Risk Score:   Fall Risk Score:  `1  Depression screen PHQ 2/9  Depression screen [facility] 2/9 01/03/2020 11/24/2019 05/30/2018 03/30/2018 02/01/2018  Decreased Interest 0 0 0 0 2  Down, Depressed, Hopeless 0 0 0 0 1  PHQ - 2 Score 0 0 0 0 3  Altered sleeping - - - - 2  Tired, decreased energy - - - - 2  Change in appetite - - - - 0  Feeling bad or failure about yourself  - - - - 2  Trouble concentrating - - - - 2  Moving slowly or fidgety/restless - - - - 0  Suicidal thoughts - - - - 0  PHQ-9 Score - - - - 11  Difficult doing work/chores - - - - Very difficult   Review of Systems  Constitutional: Negative.   HENT: Negative.   Eyes: Negative.   Respiratory: Negative.   Cardiovascular: Negative.     Gastrointestinal: Negative.   Endocrine: Negative.   Genitourinary: Negative.   Musculoskeletal: Positive for arthralgias, back pain, myalgias and neck pain.  Skin: Negative.   Allergic/Immunologic: Negative.   Neurological: Positive for weakness.       Tingling   Hematological: Negative.   Psychiatric/Behavioral: Negative.   All other systems reviewed and are negative.      Objective:   Physical Exam Vitals and nursing note reviewed.  Constitutional:      Appearance: Normal appearance.  Cardiovascular:     Rate and Rhythm: Normal rate and regular rhythm.     Pulses: Normal pulses.     Heart sounds: Normal heart sounds.  Pulmonary:     Effort: Pulmonary effort is normal.     Breath sounds: Normal breath sounds.  Musculoskeletal:     Cervical back: Normal range of motion and neck supple.     Comments: Normal Muscle Bulk and Muscle Testing Reveals:  Upper Extremities: Full ROM and Muscle Strength on Right 4/5 and Left 5/5 Right AC Joint Tenderness  Thoracic Paraspinal Tenderness: T-1-T-3  Lumbar Paraspinal Tenderness: L-3-L-5  Lower Extremities: Full ROM and Muscle Strength 5/5 Arises from chair with Ease Narrow Based Gait   Skin:    General: Skin is warm and dry.  Neurological:     Mental Status: He is alert and oriented to person, place, and time.  Psychiatric:        Mood and Affect: Mood normal.        Behavior: Behavior normal.           Assessment & Plan:  1.Cervicalgia/Cervical Spondylosis with Radiculopathy: Continue HEP as Tolerated andTopamax:04/19/2020. 2. Lumbar Spondylosis with Radiculopathy. Continue HEP as Tolerated andTopamax.[name] reports Gabapentin and Lyrica ineffective.04/19/2020 3. Injury of right shoulder and right upper arm. Continue HEP as Tolerated. Continue to Monitor.04/19/2020. 4. Chronic Pain Syndrome:Continue:Oxymorphone10 mg one tablet every 8 hours as needed for pain #90.04/19/2020. We will continue the opioid  monitoring program, this consists of regular clinic visits, examinations, urine drug screen, pill counts as well as use of 05-14-1990 Controlled Substance Reporting system. A 12 month History has been reviewed on the [place] Controlled Substance Reporting System on 04/19/2020. 5. Left Knee Pain:No Complaints Today.Continue HEP as Tolerated. Continue to Monitor.04/19/2020  F/U in 1 month.  04/21/2020 of face to face patient care  time was spent during this visit. All questions were encouraged and answered.

## 2020-04-19 NOTE — Telephone Encounter (Signed)
PMP was Reviewed: E-scribed Down: Placed a call to Erik Taylor, he will come and pick up his  Prescription today. He verbalizes understanding.

## 2020-05-17 ENCOUNTER — Encounter: Payer: Medicare HMO | Attending: Physical Medicine & Rehabilitation | Admitting: Registered Nurse

## 2020-05-17 ENCOUNTER — Encounter: Payer: Self-pay | Admitting: Registered Nurse

## 2020-05-17 ENCOUNTER — Other Ambulatory Visit: Payer: Self-pay

## 2020-05-17 VITALS — BP 152/89 | HR 82 | Temp 98.7°F | Ht 70.0 in | Wt 208.2 lb

## 2020-05-17 DIAGNOSIS — M542 Cervicalgia: Secondary | ICD-10-CM

## 2020-05-17 DIAGNOSIS — G8929 Other chronic pain: Secondary | ICD-10-CM | POA: Diagnosis present

## 2020-05-17 DIAGNOSIS — M5412 Radiculopathy, cervical region: Secondary | ICD-10-CM | POA: Diagnosis present

## 2020-05-17 DIAGNOSIS — M4722 Other spondylosis with radiculopathy, cervical region: Secondary | ICD-10-CM | POA: Diagnosis not present

## 2020-05-17 DIAGNOSIS — Z79891 Long term (current) use of opiate analgesic: Secondary | ICD-10-CM | POA: Diagnosis present

## 2020-05-17 DIAGNOSIS — S4491XS Injury of unspecified nerve at shoulder and upper arm level, right arm, sequela: Secondary | ICD-10-CM

## 2020-05-17 DIAGNOSIS — M545 Low back pain, unspecified: Secondary | ICD-10-CM

## 2020-05-17 DIAGNOSIS — G894 Chronic pain syndrome: Secondary | ICD-10-CM | POA: Diagnosis present

## 2020-05-17 DIAGNOSIS — Z5181 Encounter for therapeutic drug level monitoring: Secondary | ICD-10-CM | POA: Diagnosis present

## 2020-05-17 MED ORDER — OXYMORPHONE HCL 10 MG PO TABS: 10.0000 mg | ORAL_TABLET | Freq: Three times a day (TID) | ORAL | 0 refills | Status: DC | PRN

## 2020-05-17 NOTE — Progress Notes (Signed)
Subjective:    Patient ID: Erik Taylor, male    DOB: 20-Aug-1955, 64 y.o.   MRN: [id number]  HPI: Erik Taylor is a 64 y.o. male who returns for follow up appointment for chronic pain and medication refill. He states his pain is located in his neck radiating into his right shoulder and right arm with tingling and numbness and lower back pain. He rates his pain 8. His current exercise regime is walking and performing stretching exercises.  Erik Taylor Morphine equivalent is 90.00 MME.  UDS ordered today.    Pain Inventory Average Pain 7 Pain Right Now 8 My pain is sharp, burning, dull, stabbing, tingling and aching  In the last 24 hours, has pain interfered with the following? General activity 7 Relation with others 4 Enjoyment of life 7 What TIME of day is your pain at its worst? varies Sleep (in general) Fair  Pain is worse with: walking, bending, sitting, inactivity, standing and some activites Pain improves with: rest, heat/ice and medication Relief from Meds: 7  Family History  Problem Relation Age of Onset  . Stroke Mother   . Heart disease Father   . Heart disease Sister   . Kidney disease Brother    Social History   Socioeconomic History  . Marital status: Married    Spouse name: Not on file  . Number of children: Not on file  . Years of education: Not on file  . Highest education level: Not on file  Occupational History  . Not on file  Tobacco Use  . Smoking status: Former Smoker    Types: Cigarettes    Quit date: 02/01/1994    Years since quitting: 26.3  . Smokeless tobacco: Never Used  Substance and Sexual Activity  . Alcohol use: Never  . Drug use: Never  . Sexual activity: Not on file  Other Topics Concern  . Not on file  Social History Narrative  . Not on file   Social Determinants of Health   Financial Resource Strain:   . Difficulty of Paying Living Expenses: Not on file  Food Insecurity:   . Worried About Programme researcher, broadcasting/film/video in the  Last Year: Not on file  . Ran Out of Food in the Last Year: Not on file  Transportation Needs:   . Lack of Transportation (Medical): Not on file  . Lack of Transportation (Non-Medical): Not on file  Physical Activity:   . Days of Exercise per Week: Not on file  . Minutes of Exercise per Session: Not on file  Stress:   . Feeling of Stress : Not on file  Social Connections:   . Frequency of Communication with Friends and Family: Not on file  . Frequency of Social Gatherings with Friends and Family: Not on file  . Attends Religious Services: Not on file  . Active Member of Clubs or Organizations: Not on file  . Attends Banker Meetings: Not on file  . Marital Status: Not on file   Past Surgical History:  Procedure Laterality Date  . arm nere repair     nerve repair  . TONSILLECTOMY     Past Surgical History:  Procedure Laterality Date  . arm nere repair     nerve repair  . TONSILLECTOMY     Past Medical History:  Diagnosis Date  . Arthritis   . Diabetes mellitus without complication (HCC)   . Neuromuscular disorder (HCC)    BP (!) 152/89   Pulse  82   Temp 98.7 F (37.1 C)   Ht 5\' 10"  (1.778 m)   Wt 208 lb 3.2 oz (94.4 kg)   SpO2 98%   BMI 29.87 kg/m   Opioid Risk Score:   Fall Risk Score:  `1  Depression screen PHQ 2/9  Depression screen [facility] 2/9 01/03/2020 11/24/2019 05/30/2018 03/30/2018 02/01/2018  Decreased Interest 0 0 0 0 2  Down, Depressed, Hopeless 0 0 0 0 1  PHQ - 2 Score 0 0 0 0 3  Altered sleeping - - - - 2  Tired, decreased energy - - - - 2  Change in appetite - - - - 0  Feeling bad or failure about yourself  - - - - 2  Trouble concentrating - - - - 2  Moving slowly or fidgety/restless - - - - 0  Suicidal thoughts - - - - 0  PHQ-9 Score - - - - 11  Difficult doing work/chores - - - - Very difficult   Review of Systems  Musculoskeletal: Positive for back pain.  All other systems reviewed and are negative.      Objective:    Physical Exam Vitals and nursing note reviewed.  Constitutional:      Appearance: Normal appearance.  Cardiovascular:     Rate and Rhythm: Normal rate and regular rhythm.     Pulses: Normal pulses.     Heart sounds: Normal heart sounds.  Pulmonary:     Effort: Pulmonary effort is normal.     Breath sounds: Normal breath sounds.  Musculoskeletal:     Cervical back: Normal range of motion and neck supple.     Comments: Normal Muscle Bulk and Muscle Testing Reveals:  Upper Extremities: Right: Decreased ROM 90 Degrees and Muscle Strength 5/5 Right AC Joint Tenderness Left Upper Extremity: Full ROM and Muscle Strength 5/5  Lumbar Paraspinal Tenderness: L-3-L-5 Lower Extremities: Full ROM and Muscle Strength 5/5 Arises from chair with ease Narrow Based Gait   Skin:    General: Skin is warm and dry.  Neurological:     Mental Status: He is alert and oriented to person, place, and time.  Psychiatric:        Mood and Affect: Mood normal.        Behavior: Behavior normal.           Assessment & Plan:  1.Cervicalgia/Cervical Spondylosis with Radiculopathy: Continue HEP as Tolerated andTopamax:05/17/2020. 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 Continue HEP as Tolerated andTopamax.Mr. Nasca reports Gabapentin and Lyrica ineffective.05/17/2020 3. Injury of right shoulder and right upper arm. Continue HEP as Tolerated. Continue to Monitor.05/17/2020. 4. Chronic Pain Syndrome:Continue:Oxymorphone10 mg one tablet every 8 hours as needed for pain #90.05/17/2020. We will continue the opioid monitoring program, this consists of regular clinic visits, examinations, urine drug screen, pill counts as well as use of 13/06/2020 Controlled Substance Reporting system. A 12 month History has been reviewed on the [place] Controlled Substance Reporting Systemon 05/17/2020. 5. Left Knee Pain:No Complaints Today.Continue HEP as Tolerated.  Continue to Monitor.05/17/2020  F/U in 1 month.  13/06/2020 of face to face patient care time was spent during this visit. All questions were encouraged and answered.

## 2020-05-23 ENCOUNTER — Telehealth: Payer: Self-pay | Admitting: *Deleted

## 2020-05-23 LAB — TOXASSURE SELECT,+ANTIDEPR,UR

## 2020-05-23 NOTE — Telephone Encounter (Signed)
Urine drug screen for this encounter is consistent for prescribed medication 

## 2020-06-14 ENCOUNTER — Encounter: Payer: Medicare HMO | Attending: Physical Medicine & Rehabilitation | Admitting: Registered Nurse

## 2020-06-14 ENCOUNTER — Other Ambulatory Visit: Payer: Self-pay

## 2020-06-14 ENCOUNTER — Encounter: Payer: Self-pay | Admitting: Registered Nurse

## 2020-06-14 VITALS — BP 146/88 | HR 80 | Temp 98.7°F | Ht 70.0 in | Wt 207.0 lb

## 2020-06-14 DIAGNOSIS — M542 Cervicalgia: Secondary | ICD-10-CM | POA: Diagnosis present

## 2020-06-14 DIAGNOSIS — M4722 Other spondylosis with radiculopathy, cervical region: Secondary | ICD-10-CM

## 2020-06-14 DIAGNOSIS — M545 Low back pain, unspecified: Secondary | ICD-10-CM | POA: Diagnosis not present

## 2020-06-14 DIAGNOSIS — G8929 Other chronic pain: Secondary | ICD-10-CM | POA: Diagnosis present

## 2020-06-14 DIAGNOSIS — S4491XS Injury of unspecified nerve at shoulder and upper arm level, right arm, sequela: Secondary | ICD-10-CM | POA: Diagnosis present

## 2020-06-14 DIAGNOSIS — M5412 Radiculopathy, cervical region: Secondary | ICD-10-CM

## 2020-06-14 DIAGNOSIS — Z79891 Long term (current) use of opiate analgesic: Secondary | ICD-10-CM

## 2020-06-14 DIAGNOSIS — Z5181 Encounter for therapeutic drug level monitoring: Secondary | ICD-10-CM | POA: Diagnosis present

## 2020-06-14 DIAGNOSIS — G894 Chronic pain syndrome: Secondary | ICD-10-CM | POA: Diagnosis present

## 2020-06-14 MED ORDER — OXYMORPHONE HCL 10 MG PO TABS: 10.0000 mg | ORAL_TABLET | Freq: Three times a day (TID) | ORAL | 0 refills | Status: DC | PRN

## 2020-06-14 NOTE — Progress Notes (Signed)
Subjective:    Patient ID: Erik Taylor, male    DOB: [birth year], 64 y.o.   MRN: [id number]  HPI: Erik Taylor is a 64 y.o. male who returns for follow up appointment for chronic pain and medication refill. He states his pain is located in his neck radiating into his right arm with numbness and lower back pain. He rates his pain 8. His current exercise regime is walking and performing stretching exercises.  [name] Morphine equivalent is  90.00 MME.  Last UDS was Performed on 05/17/2020, it was consistent.    Pain Inventory Average Pain 8 Pain Right Now 8 My pain is constant, sharp, burning, dull, stabbing, tingling and aching  In the last 24 hours, has pain interfered with the following? General activity 7 Relation with others 4 Enjoyment of life 7 What TIME of day is your pain at its worst? daytime Sleep (in general) Fair  Pain is worse with: walking, bending, sitting, inactivity, standing and some activites Pain improves with: rest, heat/ice and medication Relief from Meds: 7  Family History  Problem Relation Age of Onset  . Stroke Mother   . Heart disease Father   . Heart disease Sister   . Kidney disease Brother    Social History   Socioeconomic History  . Marital status: Married    Spouse name: Not on file  . Number of children: Not on file  . Years of education: Not on file  . Highest education level: Not on file  Occupational History  . Not on file  Tobacco Use  . Smoking status: Former Smoker    Types: Cigarettes    Quit date: 02/01/1994    Years since quitting: 26.3  . Smokeless tobacco: Never Used  Substance and Sexual Activity  . Alcohol use: Never  . Drug use: Never  . Sexual activity: Not on file  Other Topics Concern  . Not on file  Social History Narrative  . Not on file   Social Determinants of Health   Financial Resource Strain: Not on file  Food Insecurity: Not on file  Transportation Needs: Not on file  Physical Activity:  Not on file  Stress: Not on file  Social Connections: Not on file   Past Surgical History:  Procedure Laterality Date  . arm nere repair     nerve repair  . TONSILLECTOMY     Past Surgical History:  Procedure Laterality Date  . arm nere repair     nerve repair  . TONSILLECTOMY     Past Medical History:  Diagnosis Date  . Arthritis   . Diabetes mellitus without complication (HCC)   . Neuromuscular disorder (HCC)    BP (!) 146/88   Pulse 80   Temp 98.7 F (37.1 C)   Ht 5\' 10"  (1.778 m)   Wt 207 lb (93.9 kg)   SpO2 97%   BMI 29.70 kg/m   Opioid Risk Score:   Fall Risk Score:  `1  Depression screen PHQ 2/9  Depression screen [facility] 2/9 01/03/2020 11/24/2019 05/30/2018 03/30/2018 02/01/2018  Decreased Interest 0 0 0 0 2  Down, Depressed, Hopeless 0 0 0 0 1  PHQ - 2 Score 0 0 0 0 3  Altered sleeping - - - - 2  Tired, decreased energy - - - - 2  Change in appetite - - - - 0  Feeling bad or failure about yourself  - - - - 2  Trouble concentrating - - - -  2  Moving slowly or fidgety/restless - - - - 0  Suicidal thoughts - - - - 0  PHQ-9 Score - - - - 11  Difficult doing work/chores - - - - Very difficult    Review of Systems  Constitutional: Negative.   HENT: Negative.   Eyes: Negative.   Respiratory: Negative.   Cardiovascular: Negative.   Gastrointestinal: Negative.   Endocrine: Negative.   Genitourinary: Negative.   Musculoskeletal: Positive for back pain and neck pain.  Skin: Negative.   Allergic/Immunologic: Negative.   Neurological: Positive for numbness.       Tingling  Hematological: Negative.   Psychiatric/Behavioral: Negative.   All other systems reviewed and are negative.      Objective:   Physical Exam Vitals and nursing note reviewed.  Constitutional:      Appearance: Normal appearance.  Neck:     Comments: Cervical Paraspinal Tenderness: C-5-C-6 Cardiovascular:     Rate and Rhythm: Normal rate and regular rhythm.     Pulses: Normal  pulses.     Heart sounds: Normal heart sounds.  Pulmonary:     Effort: Pulmonary effort is normal.     Breath sounds: Normal breath sounds.  Musculoskeletal:     Cervical back: Normal range of motion and neck supple.     Comments: Normal Muscle Bulk and Muscle Testing Reveals:  Upper Extremities: Right: Decreased ROM 90 Degrees  and Muscle Strength  4/5  Left Upper Extremity: Full ROM and Muscle Strength 5/5 Bilateral AC Joint Tenderness Lumbar Paraspinal Tenderness: L-3-L-5 Lower Extremities: Full ROM and Muscle Strength 5/5 Arises from chair with ease Narrow Based  Gait   Skin:    General: Skin is warm and dry.  Neurological:     Mental Status: He is alert and oriented to person, place, and time.  Psychiatric:        Mood and Affect: Mood normal.        Behavior: Behavior normal.           Assessment & Plan:  1.Cervicalgia/Cervical Spondylosis with Radiculopathy: Continue HEP as Tolerated andTopamax:06/14/2020. 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 Continue HEP as Tolerated andTopamax.[name] reports Gabapentin and Lyrica ineffective.06/14/2020 3. Injury of right shoulder and right upper arm. Continue HEP as Tolerated. Continue to Monitor.06/14/2020. 4. Chronic Pain Syndrome:Continue:Oxymorphone10 mg one tablet every 8 hours as needed for pain #90.06/14/2020. We will continue the opioid monitoring program, this consists of regular clinic visits, examinations, urine drug screen, pill counts as well as use of [place] Controlled Substance Reporting system. A 12 month History has been reviewed on the [place] Controlled Substance Reporting Systemon11/06/2020. 5. Left Knee Pain:No Complaints Today.Continue HEP as Tolerated. Continue to Monitor.06/14/2020  F/U in 1 month.

## 2020-07-12 ENCOUNTER — Encounter: Payer: Self-pay | Admitting: Registered Nurse

## 2020-07-12 ENCOUNTER — Other Ambulatory Visit: Payer: Self-pay

## 2020-07-12 ENCOUNTER — Encounter: Payer: Medicare HMO | Attending: Physical Medicine & Rehabilitation | Admitting: Registered Nurse

## 2020-07-12 VITALS — BP 146/90 | HR 71 | Temp 98.4°F | Ht 70.0 in | Wt 210.0 lb

## 2020-07-12 DIAGNOSIS — M542 Cervicalgia: Secondary | ICD-10-CM

## 2020-07-12 DIAGNOSIS — S4491XS Injury of unspecified nerve at shoulder and upper arm level, right arm, sequela: Secondary | ICD-10-CM

## 2020-07-12 DIAGNOSIS — Z79891 Long term (current) use of opiate analgesic: Secondary | ICD-10-CM

## 2020-07-12 DIAGNOSIS — M545 Low back pain, unspecified: Secondary | ICD-10-CM

## 2020-07-12 DIAGNOSIS — Z5181 Encounter for therapeutic drug level monitoring: Secondary | ICD-10-CM | POA: Diagnosis present

## 2020-07-12 DIAGNOSIS — M4722 Other spondylosis with radiculopathy, cervical region: Secondary | ICD-10-CM | POA: Diagnosis not present

## 2020-07-12 DIAGNOSIS — G8929 Other chronic pain: Secondary | ICD-10-CM

## 2020-07-12 DIAGNOSIS — G894 Chronic pain syndrome: Secondary | ICD-10-CM | POA: Diagnosis present

## 2020-07-12 DIAGNOSIS — M5412 Radiculopathy, cervical region: Secondary | ICD-10-CM

## 2020-07-12 MED ORDER — OXYMORPHONE HCL 10 MG PO TABS: 10.0000 mg | ORAL_TABLET | Freq: Three times a day (TID) | ORAL | 0 refills | Status: DC | PRN

## 2020-07-12 NOTE — Progress Notes (Signed)
Subjective:    Patient ID: Erik Taylor, male    DOB: Nov 30, 1955, 65 y.o.   MRN: [id number]  HPI: Erik Taylor is a 65 y.o. male who returns for follow up appointment for chronic pain and medication refill. He states his pain is located in his neck radiating into his right shoulder and right arm with tingling and numbness. Also reports lower back pain. He rates his pain 8. His current exercise regime is walking and performing stretching exercises.  Erik Taylor Morphine equivalent is 90.00 MME. Erik Taylor also reports he has two days of medication in his pill container, he will bring all medications with him going forward. He verbalizes understanding.    Last UDS was Performed on 05/17/2020, it was consistent.     Pain Inventory Average Pain 8 Pain Right Now 8 My pain is sharp, burning, dull, stabbing, tingling and aching  In the last 24 hours, has pain interfered with the following? General activity 6 Relation with others 4 Enjoyment of life 6 What TIME of day is your pain at its worst? daytime Sleep (in general) NA  Pain is worse with: walking, bending, sitting, inactivity, standing and some activites Pain improves with: rest, heat/ice and medication Relief from Meds: 7  Family History  Problem Relation Age of Onset  . Stroke Mother   . Heart disease Father   . Heart disease Sister   . Kidney disease Brother    Social History   Socioeconomic History  . Marital status: Married    Spouse name: Not on file  . Number of children: Not on file  . Years of education: Not on file  . Highest education level: Not on file  Occupational History  . Not on file  Tobacco Use  . Smoking status: Former Smoker    Types: Cigarettes    Quit date: 02/01/1994    Years since quitting: 26.4  . Smokeless tobacco: Never Used  Substance and Sexual Activity  . Alcohol use: Never  . Drug use: Never  . Sexual activity: Not on file  Other Topics Concern  . Not on file  Social  History Narrative  . Not on file   Social Determinants of Health   Financial Resource Strain: Not on file  Food Insecurity: Not on file  Transportation Needs: Not on file  Physical Activity: Not on file  Stress: Not on file  Social Connections: Not on file   Past Surgical History:  Procedure Laterality Date  . arm nere repair     nerve repair  . TONSILLECTOMY     Past Surgical History:  Procedure Laterality Date  . arm nere repair     nerve repair  . TONSILLECTOMY     Past Medical History:  Diagnosis Date  . Arthritis   . Diabetes mellitus without complication (HCC)   . Neuromuscular disorder (HCC)    BP (!) 146/90   Pulse 71   Temp 98.4 F (36.9 C)   Ht 5\' 10"  (1.778 m)   Wt 210 lb (95.3 kg)   SpO2 98%   BMI 30.13 kg/m   Opioid Risk Score:   Fall Risk Score:  `1  Depression screen PHQ 2/9  [facility] 2/9 01/03/2020 11/24/2019 05/30/2018 03/30/2018 02/01/2018  Decreased Interest 0 0 0 0 2  Down, Depressed, Hopeless 0 0 0 0 1  PHQ - 2 Score 0 0 0 0 3  Altered sleeping - - - - 2  Tired, decreased energy - - - -  2  Change in appetite - - - - 0  Feeling bad or failure about yourself  - - - - 2  Trouble concentrating - - - - 2  Moving slowly or fidgety/restless - - - - 0  Suicidal thoughts - - - - 0  PHQ-9 Score - - - - 11  Difficult doing work/chores - - - - Very difficult    Review of Systems  Constitutional: Negative.   HENT: Negative.   Eyes: Negative.   Respiratory: Negative.   Cardiovascular: Negative.   Gastrointestinal: Negative.   Endocrine: Negative.   Musculoskeletal: Positive for arthralgias, back pain and neck pain.  Skin: Negative.   Allergic/Immunologic: Negative.   Neurological: Negative.   Hematological: Negative.   Psychiatric/Behavioral: Negative.   All other systems reviewed and are negative.      Objective:   Physical Exam Vitals and nursing note reviewed.  Constitutional:      Appearance: Normal appearance.   Neck:     Comments: Cervical Paraspinal Tenderness: C-4-C-6 Cardiovascular:     Rate and Rhythm: Normal rate and regular rhythm.     Pulses: Normal pulses.     Heart sounds: Normal heart sounds.  Pulmonary:     Effort: Pulmonary effort is normal.     Breath sounds: Normal breath sounds.  Musculoskeletal:     Cervical back: Normal range of motion and neck supple.     Comments: Normal Muscle Bulk and Muscle Testing Reveals:  Upper Extremities: Full OM and Muscle Strength on Right 4/5 and Left 5/5 Right AC Joint Tenderness  Lumbar Paraspinal Tenderness: L-4-L-5 Lower Extremities: Full ROM and Muscle Strength 5/5 Arises from chair with ease Narrow Based  Gait   Skin:    General: Skin is warm and dry.  Neurological:     Mental Status: He is alert and oriented to person, place, and time.  Psychiatric:        Mood and Affect: Mood normal.        Behavior: Behavior normal.           Assessment & Plan:  1.Cervicalgia/Cervical Spondylosis with Radiculopathy: Continue HEP as Tolerated andTopamax:07/12/2020. 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 Continue HEP as Tolerated andTopamax.Erik Taylor reports Gabapentin and Lyrica ineffective.07/12/2020 3. Injury of right shoulder and right upper arm. Continue HEP as Tolerated. Continue to Monitor.07/12/2020. 4. Chronic Pain Syndrome:Continue:Oxymorphone10 mg one tablet every 8 hours as needed for pain #90.07/12/2020. We will continue the opioid monitoring program, this consists of regular clinic visits, examinations, urine drug screen, pill counts as well as use of [place] Controlled Substance Reporting system. A 12 month History has been reviewed on the [place] Controlled Substance Reporting Systemon01/01/2021. 5. Left Knee Pain:No Complaints Today.Continue HEP as Tolerated. Continue to Monitor.07/12/2020  F/U in 1 month.

## 2020-08-09 ENCOUNTER — Other Ambulatory Visit: Payer: Self-pay

## 2020-08-09 ENCOUNTER — Encounter: Payer: Self-pay | Admitting: Registered Nurse

## 2020-08-09 ENCOUNTER — Encounter: Payer: Medicare HMO | Attending: Physical Medicine & Rehabilitation | Admitting: Registered Nurse

## 2020-08-09 VITALS — BP 143/87 | HR 81 | Temp 98.2°F | Ht 70.0 in | Wt 209.6 lb

## 2020-08-09 DIAGNOSIS — M5412 Radiculopathy, cervical region: Secondary | ICD-10-CM

## 2020-08-09 DIAGNOSIS — M542 Cervicalgia: Secondary | ICD-10-CM

## 2020-08-09 DIAGNOSIS — Z79891 Long term (current) use of opiate analgesic: Secondary | ICD-10-CM | POA: Diagnosis present

## 2020-08-09 DIAGNOSIS — Z5181 Encounter for therapeutic drug level monitoring: Secondary | ICD-10-CM

## 2020-08-09 DIAGNOSIS — M5416 Radiculopathy, lumbar region: Secondary | ICD-10-CM | POA: Diagnosis present

## 2020-08-09 DIAGNOSIS — G894 Chronic pain syndrome: Secondary | ICD-10-CM | POA: Diagnosis present

## 2020-08-09 DIAGNOSIS — M4722 Other spondylosis with radiculopathy, cervical region: Secondary | ICD-10-CM | POA: Insufficient documentation

## 2020-08-09 DIAGNOSIS — S4491XS Injury of unspecified nerve at shoulder and upper arm level, right arm, sequela: Secondary | ICD-10-CM | POA: Diagnosis present

## 2020-08-09 MED ORDER — OXYMORPHONE HCL 10 MG PO TABS: 10.0000 mg | ORAL_TABLET | Freq: Three times a day (TID) | ORAL | 0 refills | Status: DC | PRN

## 2020-08-09 MED ORDER — GABAPENTIN 100 MG PO CAPS
100.0000 mg | ORAL_CAPSULE | Freq: Three times a day (TID) | ORAL | 3 refills | Status: DC
Start: 2020-08-09 — End: 2020-12-05

## 2020-08-09 NOTE — Progress Notes (Signed)
Subjective:    Patient ID: Erik Taylor, male    DOB: Jan 03, 1956, 65 y.o.   MRN: [id number]  HPI : Erik Taylor is a 65 y.o. male who returns for follow up appointment for chronic pain and medication refill.He states his  pain is located in his neck radiating into his right shoulder and lower back pain radiating into his bilateral hips. Also reports increase intensity of neuropathic pain over the last few weeks with weather changes and death in his family. He rates his pain 8. His current exercise regime is walking and performing stretching exercises.  [name] reports his uncle passed away last week from COVID, emotional support given. Also states 4 of his church members passed away, he was feeling very anxious. He reports his cousin gave him a Xanax. He was educated on the narcotic policy, he verbalizes understanding. He was instructed not to take any medication from anyone and to call office first. He verbalizes understanding. UDS ordered. Emotional Support Given   [name] Morphine equivalent is 90.00 MME.    Last UDS was Performed on 05/17/2020, it was consistent.   Pain Inventory Average Pain 7 Pain Right Now 8 My pain is constant, sharp, burning, dull, stabbing, tingling and aching  In the last 24 hours, has pain interfered with the following? General activity 7 Relation with others 4 Enjoyment of life 7 What TIME of day is your pain at its worst? daytime Sleep (in general) Fair  Pain is worse with: walking, bending, sitting, inactivity, standing and some activites Pain improves with: rest, heat/ice and medication Relief from Meds: 7  Family History  Problem Relation Age of Onset  . Stroke Mother   . Heart disease Father   . Heart disease Sister   . Kidney disease Brother    Social History   Socioeconomic History  . Marital status: Married    Spouse name: Not on file  . Number of children: Not on file  . Years of education: Not on file  . Highest  education level: Not on file  Occupational History  . Not on file  Tobacco Use  . Smoking status: Former Smoker    Types: Cigarettes    Quit date: 02/01/1994    Years since quitting: 26.5  . Smokeless tobacco: Never Used  Vaping Use  . Vaping Use: Never used  Substance and Sexual Activity  . Alcohol use: Never  . Drug use: Never  . Sexual activity: Not on file  Other Topics Concern  . Not on file  Social History Narrative  . Not on file   Social Determinants of Health   Financial Resource Strain: Not on file  Food Insecurity: Not on file  Transportation Needs: Not on file  Physical Activity: Not on file  Stress: Not on file  Social Connections: Not on file   Past Surgical History:  Procedure Laterality Date  . arm nere repair     nerve repair  . TONSILLECTOMY     Past Surgical History:  Procedure Laterality Date  . arm nere repair     nerve repair  . TONSILLECTOMY     Past Medical History:  Diagnosis Date  . Arthritis   . Diabetes mellitus without complication (HCC)   . Neuromuscular disorder (HCC)    BP (!) 143/87   Pulse 81   Temp 98.2 F (36.8 C)   Ht 5\' 10"  (1.778 m)   Wt 209 lb 9.6 oz (95.1 kg)   SpO2  97%   BMI 30.07 kg/m   Opioid Risk Score:   Fall Risk Score:  `1  Depression screen PHQ 2/9  Depression screen [facility] 2/9 08/09/2020 01/03/2020 11/24/2019 05/30/2018 03/30/2018 02/01/2018  Decreased Interest 0 0 0 0 0 2  Down, Depressed, Hopeless 0 0 0 0 0 1  PHQ - 2 Score 0 0 0 0 0 3  Altered sleeping - - - - - 2  Tired, decreased energy - - - - - 2  Change in appetite - - - - - 0  Feeling bad or failure about yourself  - - - - - 2  Trouble concentrating - - - - - 2  Moving slowly or fidgety/restless - - - - - 0  Suicidal thoughts - - - - - 0  PHQ-9 Score - - - - - 11  Difficult doing work/chores - - - - - Very difficult   Review of Systems  Musculoskeletal: Positive for back pain and neck pain.       Right arm  & right shoulder pain  All other  systems reviewed and are negative.      Objective:   Physical Exam Vitals and nursing note reviewed.  Constitutional:      Appearance: Normal appearance.  Neck:     Comments: Cervical Paraspinal Tenderness: C-4-C-6 Cardiovascular:     Rate and Rhythm: Normal rate and regular rhythm.     Pulses: Normal pulses.     Heart sounds: Normal heart sounds.  Pulmonary:     Effort: Pulmonary effort is normal.     Breath sounds: Normal breath sounds.  Musculoskeletal:     Cervical back: Normal range of motion and neck supple.     Comments: Normal Muscle Bulk and Muscle Testing Reveals:  Upper Extremities: Full ROM and Muscle Strength on Right 4/5 and Left 5/5 Bilateral AC Joint Tenderness Lumbar Paraspinal Tenderness: L-3-L-5 Bilateral Greater Trochanter Tenderness Lower Extremities: Full ROM and Muscle Strength 5/5 Arises from chair with ease Narrow Based Gait   Skin:    General: Skin is warm and dry.  Neurological:     Mental Status: He is alert and oriented to person, place, and time.  Psychiatric:        Mood and Affect: Mood normal.        Behavior: Behavior normal.           Assessment & Plan:  1.Cervicalgia/Cervical Spondylosis with Radiculopathy: Continue HEP as Tolerated. RX: Gabapentin with instructions. One Capsule at HS only, send a My-Chart or call office on Monday 08/12/20, he verbalizes understanding. Topamax discontinued ineffective he reports. :08/09/2020. 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 Continue HEP as Tolerated . RX: Gabapentin. 08/09/2020. 3. Injury of right shoulder and right upper arm. Continue HEP as Tolerated. Continue to Monitor.08/09/2020. 4. Chronic Pain Syndrome:Continue:Oxymorphone10 mg one tablet every 8 hours as needed for pain #90.08/09/2020. We will continue the opioid monitoring program, this consists of regular clinic visits, examinations, urine drug screen, pill counts as well as use of  [place] Controlled Substance Reporting system. A 12 month History has been reviewed on the [place] Controlled Substance Reporting Systemon02/10/2020. 5. Left Knee Pain:No Complaints Today.Continue HEP as Tolerated. Continue to Monitor.08/09/2020  F/U in 1 month.

## 2020-08-09 NOTE — Patient Instructions (Signed)
Start Gabapentin Tonight: One Capsule at Bedtime only.  Send a My-Chart Message on Monday 07/12/2020, with an update on new medication.

## 2020-08-16 ENCOUNTER — Ambulatory Visit: Payer: Medicare HMO | Admitting: Registered Nurse

## 2020-08-22 LAB — TOXASSURE SELECT,+ANTIDEPR,UR

## 2020-08-27 ENCOUNTER — Telehealth: Payer: Self-pay | Admitting: *Deleted

## 2020-08-27 NOTE — Telephone Encounter (Signed)
Urine drug screen for this encounter is consistent for prescribed medication 

## 2020-09-06 ENCOUNTER — Encounter: Payer: Medicare HMO | Attending: Physical Medicine & Rehabilitation | Admitting: Registered Nurse

## 2020-09-06 ENCOUNTER — Other Ambulatory Visit: Payer: Self-pay

## 2020-09-06 ENCOUNTER — Encounter: Payer: Self-pay | Admitting: Registered Nurse

## 2020-09-06 VITALS — BP 136/87 | HR 79 | Temp 98.2°F | Ht 70.0 in | Wt 206.0 lb

## 2020-09-06 DIAGNOSIS — M5416 Radiculopathy, lumbar region: Secondary | ICD-10-CM | POA: Insufficient documentation

## 2020-09-06 DIAGNOSIS — S4491XS Injury of unspecified nerve at shoulder and upper arm level, right arm, sequela: Secondary | ICD-10-CM | POA: Insufficient documentation

## 2020-09-06 DIAGNOSIS — M5412 Radiculopathy, cervical region: Secondary | ICD-10-CM

## 2020-09-06 DIAGNOSIS — M542 Cervicalgia: Secondary | ICD-10-CM | POA: Diagnosis not present

## 2020-09-06 DIAGNOSIS — Z79891 Long term (current) use of opiate analgesic: Secondary | ICD-10-CM | POA: Insufficient documentation

## 2020-09-06 DIAGNOSIS — Z5181 Encounter for therapeutic drug level monitoring: Secondary | ICD-10-CM | POA: Insufficient documentation

## 2020-09-06 DIAGNOSIS — M4722 Other spondylosis with radiculopathy, cervical region: Secondary | ICD-10-CM | POA: Diagnosis present

## 2020-09-06 DIAGNOSIS — G894 Chronic pain syndrome: Secondary | ICD-10-CM | POA: Diagnosis present

## 2020-09-06 MED ORDER — OXYMORPHONE HCL 10 MG PO TABS: 10.0000 mg | ORAL_TABLET | Freq: Three times a day (TID) | ORAL | 0 refills | Status: DC | PRN

## 2020-09-06 NOTE — Progress Notes (Signed)
Subjective:    Patient ID: Erik Taylor, male    DOB: Dec 22, 1955, 65 y.o.   MRN: [id number]  HPI: Erik Taylor is a 65 y.o. male who returns for follow up appointment for chronic pain and medication refill. He states his pain is located in his neck radiating into his right shoulder and right arm with tingling and burning. Also reports lower back pain. He rates his pain 8. His current exercise regime is walking and performing stretching exercises.  Erik Taylor Morphine equivalent is 90.00  MME. Last UDS was Performed on 08/09/2020, it was consistent.    Pain Inventory Average Pain 8 Pain Right Now 8 My pain is constant, sharp, burning, dull, stabbing, tingling and aching  In the last 24 hours, has pain interfered with the following? General activity 8 Relation with others 4 Enjoyment of life 7 What TIME of day is your pain at its worst? daytime Sleep (in general) Fair  Pain is worse with: walking, bending, sitting, inactivity, standing and some activites Pain improves with: rest, heat/ice and medication Relief from Meds: 7  Family History  Problem Relation Age of Onset  . Stroke Mother   . Heart disease Father   . Heart disease Sister   . Kidney disease Brother    Social History   Socioeconomic History  . Marital status: Married    Spouse name: Not on file  . Number of children: Not on file  . Years of education: Not on file  . Highest education level: Not on file  Occupational History  . Not on file  Tobacco Use  . Smoking status: Former Smoker    Types: Cigarettes    Quit date: 02/01/1994    Years since quitting: 26.6  . Smokeless tobacco: Never Used  Vaping Use  . Vaping Use: Never used  Substance and Sexual Activity  . Alcohol use: Never  . Drug use: Never  . Sexual activity: Not on file  Other Topics Concern  . Not on file  Social History Narrative  . Not on file   Social Determinants of Health   Financial Resource Strain: Not on file  Food  Insecurity: Not on file  Transportation Needs: Not on file  Physical Activity: Not on file  Stress: Not on file  Social Connections: Not on file   Past Surgical History:  Procedure Laterality Date  . arm nere repair     nerve repair  . TONSILLECTOMY     Past Surgical History:  Procedure Laterality Date  . arm nere repair     nerve repair  . TONSILLECTOMY     Past Medical History:  Diagnosis Date  . Arthritis   . Diabetes mellitus without complication (HCC)   . Neuromuscular disorder (HCC)    BP 136/87   Pulse 79   Temp 98.2 F (36.8 C)   Ht 5\' 10"  (1.778 m)   Wt 206 lb (93.4 kg)   SpO2 97%   BMI 29.56 kg/m   Opioid Risk Score:   Fall Risk Score:  `1  Depression screen PHQ 2/9  Depression screen [facility] 2/9 08/09/2020 01/03/2020 11/24/2019 05/30/2018 03/30/2018 02/01/2018  Decreased Interest 0 0 0 0 0 2  Down, Depressed, Hopeless 0 0 0 0 0 1  PHQ - 2 Score 0 0 0 0 0 3  Altered sleeping - - - - - 2  Tired, decreased energy - - - - - 2  Change in appetite - - - - - 0  Feeling bad or failure about yourself  - - - - - 2  Trouble concentrating - - - - - 2  Moving slowly or fidgety/restless - - - - - 0  Suicidal thoughts - - - - - 0  PHQ-9 Score - - - - - 11  Difficult doing work/chores - - - - - Very difficult   Review of Systems  Musculoskeletal: Positive for back pain, gait problem and neck pain.       Right arm pain, right hip pain  All other systems reviewed and are negative.      Objective:   Physical Exam Vitals and nursing note reviewed.  Constitutional:      Appearance: Normal appearance.  Cardiovascular:     Rate and Rhythm: Normal rate and regular rhythm.     Pulses: Normal pulses.     Heart sounds: Normal heart sounds.  Pulmonary:     Effort: Pulmonary effort is normal.     Breath sounds: Normal breath sounds.  Musculoskeletal:     Cervical back: Normal range of motion and neck supple.     Comments: Normal Muscle Bulk and Muscle Testing Reveals:   Upper Extremities: Full ROM and Muscle Strength on Right 4/5 and Left 5/5 Right AC Joint Tenderness Lumbar Paraspinal Tenderness: L-3-L-5 Lower Extremities: Full ROM and Muscle Strength 5/5 Arises from chair with ease Narrow Based  Gait   Skin:    General: Skin is warm and dry.  Neurological:     Mental Status: He is alert and oriented to person, place, and time.  Psychiatric:        Mood and Affect: Mood normal.        Behavior: Behavior normal.           Assessment & Plan:  1.Cervicalgia/Cervical Spondylosis with Radiculopathy: Continue HEP as Tolerated. RX: Gabapentin with instructions. One Capsule at HS only, send a My-Chart or call office on Monday 08/12/20, he verbalizes understanding. Topamax discontinued ineffective he reports. :09/06/2020. 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 Continue HEP as Tolerated . Continue Gabapentin. 09/06/2020. 3. Injury of right shoulder and right upper arm. Continue HEP as Tolerated. Continue to Monitor.09/06/2020. 4. Chronic Pain Syndrome:Continue:Oxymorphone10 mg one tablet every 8 hours as needed for pain #90.09/06/2020. We will continue the opioid monitoring program, this consists of regular clinic visits, examinations, urine drug screen, pill counts as well as use of [place] Controlled Substance Reporting system. A 12 month History has been reviewed on the [place] Controlled Substance Reporting Systemon03/10/2020. 5. Left Knee Pain:No Complaints Today.Continue HEP as Tolerated. Continue to Monitor.09/06/2020  F/U in 1 month.

## 2020-10-04 ENCOUNTER — Other Ambulatory Visit: Payer: Self-pay

## 2020-10-04 ENCOUNTER — Encounter: Payer: Self-pay | Admitting: Registered Nurse

## 2020-10-04 ENCOUNTER — Encounter: Payer: Medicare HMO | Attending: Physical Medicine & Rehabilitation | Admitting: Registered Nurse

## 2020-10-04 VITALS — BP 152/88 | HR 75 | Temp 98.7°F | Ht 70.0 in | Wt 208.6 lb

## 2020-10-04 DIAGNOSIS — S4491XS Injury of unspecified nerve at shoulder and upper arm level, right arm, sequela: Secondary | ICD-10-CM | POA: Diagnosis present

## 2020-10-04 DIAGNOSIS — Z79891 Long term (current) use of opiate analgesic: Secondary | ICD-10-CM | POA: Insufficient documentation

## 2020-10-04 DIAGNOSIS — M542 Cervicalgia: Secondary | ICD-10-CM

## 2020-10-04 DIAGNOSIS — M5412 Radiculopathy, cervical region: Secondary | ICD-10-CM | POA: Insufficient documentation

## 2020-10-04 DIAGNOSIS — M5416 Radiculopathy, lumbar region: Secondary | ICD-10-CM | POA: Diagnosis present

## 2020-10-04 DIAGNOSIS — G894 Chronic pain syndrome: Secondary | ICD-10-CM

## 2020-10-04 DIAGNOSIS — Z5181 Encounter for therapeutic drug level monitoring: Secondary | ICD-10-CM | POA: Diagnosis present

## 2020-10-04 DIAGNOSIS — M4722 Other spondylosis with radiculopathy, cervical region: Secondary | ICD-10-CM | POA: Diagnosis present

## 2020-10-04 MED ORDER — OXYMORPHONE HCL 10 MG PO TABS: 10.0000 mg | ORAL_TABLET | Freq: Three times a day (TID) | ORAL | 0 refills | Status: DC | PRN

## 2020-10-04 NOTE — Progress Notes (Signed)
Subjective:    Patient ID: Erik Taylor, male    DOB: 05-31-1956, 65 y.o.   MRN: [id number]  HPI: Erik Taylor is a 65 y.o. male who returns for follow up appointment for chronic pain and medication refill. He states his pain is located in his neck radiating into his right shoulder and right arm with tingling. Also reports lower back pain radiating into his right lower extremity. He rates his pain 8. His current exercise regime is walking and performing stretching exercises.  Erik Taylor Morphine equivalent is 90.00 MME.    Last UDS was performed on 08/09/2020, it was consistent.  Pain Inventory Average Pain 7 Pain Right Now 8 My pain is sharp, burning, tingling and aching  In the last 24 hours, has pain interfered with the following? General activity 8 Relation with others 4 Enjoyment of life 7 What TIME of day is your pain at its worst? daytime Sleep (in general) Poor  Pain is worse with: walking, bending, sitting, inactivity, standing and some activites Pain improves with: rest, heat/ice and medication Relief from Meds: NOT ANSWERED  Family History  Problem Relation Age of Onset  . Stroke Mother   . Heart disease Father   . Heart disease Sister   . Kidney disease Brother    Social History   Socioeconomic History  . Marital status: Married    Spouse name: Not on file  . Number of children: Not on file  . Years of education: Not on file  . Highest education level: Not on file  Occupational History  . Not on file  Tobacco Use  . Smoking status: Former Smoker    Types: Cigarettes    Quit date: 02/01/1994    Years since quitting: 26.6  . Smokeless tobacco: Never Used  Vaping Use  . Vaping Use: Never used  Substance and Sexual Activity  . Alcohol use: Never  . Drug use: Never  . Sexual activity: Not on file  Other Topics Concern  . Not on file  Social History Narrative  . Not on file   Social Determinants of Health   Financial Resource Strain: Not on  file  Food Insecurity: Not on file  Transportation Needs: Not on file  Physical Activity: Not on file  Stress: Not on file  Social Connections: Not on file   Past Surgical History:  Procedure Laterality Date  . arm nere repair     nerve repair  . TONSILLECTOMY     Past Surgical History:  Procedure Laterality Date  . arm nere repair     nerve repair  . TONSILLECTOMY     Past Medical History:  Diagnosis Date  . Arthritis   . Diabetes mellitus without complication (HCC)   . Neuromuscular disorder (HCC)    BP (!) 152/88   Pulse 75   Temp 98.7 F (37.1 C)   Ht 5\' 10"  (1.778 m)   Wt 208 lb 9.6 oz (94.6 kg)   SpO2 96%   BMI 29.93 kg/m   Opioid Risk Score:   Fall Risk Score:  `1  Depression screen PHQ 2/9  Depression screen [facility] 2/9 10/04/2020 09/06/2020 08/09/2020 01/03/2020 11/24/2019 05/30/2018 03/30/2018  Decreased Interest 0 0 0 0 0 0 0  Down, Depressed, Hopeless 0 0 0 0 0 0 0  PHQ - 2 Score 0 0 0 0 0 0 0  Altered sleeping - - - - - - -  Tired, decreased energy - - - - - - -  Change in appetite - - - - - - -  Feeling bad or failure about yourself  - - - - - - -  Trouble concentrating - - - - - - -  Moving slowly or fidgety/restless - - - - - - -  Suicidal thoughts - - - - - - -  PHQ-9 Score - - - - - - -  Difficult doing work/chores - - - - - - -      Review of Systems  Constitutional: Negative.   HENT: Negative.   Eyes: Negative.   Respiratory: Negative.   Cardiovascular: Negative.   Gastrointestinal: Negative.   Endocrine: Negative.   Genitourinary: Negative.   Musculoskeletal: Positive for back pain and neck pain.       PAIN IN RIGHT ARM   Skin: Negative.   Allergic/Immunologic: Negative.   Neurological: Negative.   Hematological: Negative.   Psychiatric/Behavioral: Negative.        Objective:   Physical Exam Vitals and nursing note reviewed.  Constitutional:      Appearance: Normal appearance.  Cardiovascular:     Rate and Rhythm: Normal rate  and regular rhythm.     Pulses: Normal pulses.     Heart sounds: Normal heart sounds.  Pulmonary:     Effort: Pulmonary effort is normal.     Breath sounds: Normal breath sounds.  Musculoskeletal:     Cervical back: Normal range of motion and neck supple.     Comments: Normal Muscle Bulk and Muscle Testing Reveals: Upper Extremities: Right: Decreased ROM 90 Degrees  and Muscle Strength 4/5 Left : Full ROM and Muscle Strength 5/5  Lumbar Paraspinal Tenderness: L-4-L-5 Lower Extremities: Full ROM and Muscle Strength 5/5 Arises from chair with ease Narrow Based  Gait   Skin:    General: Skin is warm and dry.  Neurological:     Mental Status: He is alert and oriented to person, place, and time.  Psychiatric:        Mood and Affect: Mood normal.        Behavior: Behavior normal.           Assessment & Plan:  1.Cervicalgia/Cervical Spondylosis with Radiculopathy: Continue HEP as Tolerated. RX: Gabapentin with instructions. One Capsule at HS only, send a My-Chart or call office on Monday 08/12/20, he verbalizes understanding.Topamaxdiscontinued ineffective he reports.:10/04/2020. 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 Continue HEP as Tolerated. Continue Gabapentin. 10/04/2020. 3. Injury of right shoulder and right upper arm. Continue HEP as Tolerated. Continue to Monitor.10/04/2020. 4. Chronic Pain Syndrome:Continue:Oxymorphone10 mg one tablet every 8 hours as needed for pain #90.10/04/2020. We will continue the opioid monitoring program, this consists of regular clinic visits, examinations, urine drug screen, pill counts as well as use of [place] Controlled Substance Reporting system. A 12 month History has been reviewed on the [place] Controlled Substance Reporting Systemon04/07/2020. 5. Left Knee Pain:No Complaints Today.Continue HEP as Tolerated. Continue to Monitor.10/04/2020  F/U in 1 month.

## 2020-11-06 ENCOUNTER — Encounter: Payer: Medicare HMO | Admitting: Registered Nurse

## 2020-11-07 ENCOUNTER — Encounter: Payer: Medicare HMO | Attending: Physical Medicine & Rehabilitation | Admitting: Registered Nurse

## 2020-11-07 ENCOUNTER — Encounter: Payer: Self-pay | Admitting: Registered Nurse

## 2020-11-07 ENCOUNTER — Other Ambulatory Visit: Payer: Self-pay

## 2020-11-07 VITALS — BP 135/77 | HR 68 | Temp 97.8°F | Ht 70.0 in | Wt 204.0 lb

## 2020-11-07 DIAGNOSIS — G894 Chronic pain syndrome: Secondary | ICD-10-CM

## 2020-11-07 DIAGNOSIS — M792 Neuralgia and neuritis, unspecified: Secondary | ICD-10-CM | POA: Diagnosis present

## 2020-11-07 DIAGNOSIS — M5412 Radiculopathy, cervical region: Secondary | ICD-10-CM | POA: Diagnosis present

## 2020-11-07 DIAGNOSIS — M4722 Other spondylosis with radiculopathy, cervical region: Secondary | ICD-10-CM | POA: Diagnosis present

## 2020-11-07 DIAGNOSIS — S4491XS Injury of unspecified nerve at shoulder and upper arm level, right arm, sequela: Secondary | ICD-10-CM

## 2020-11-07 DIAGNOSIS — Z79891 Long term (current) use of opiate analgesic: Secondary | ICD-10-CM

## 2020-11-07 DIAGNOSIS — M542 Cervicalgia: Secondary | ICD-10-CM | POA: Diagnosis present

## 2020-11-07 DIAGNOSIS — Z5181 Encounter for therapeutic drug level monitoring: Secondary | ICD-10-CM

## 2020-11-07 DIAGNOSIS — M5416 Radiculopathy, lumbar region: Secondary | ICD-10-CM | POA: Diagnosis present

## 2020-11-07 MED ORDER — OXYMORPHONE HCL 10 MG PO TABS: 10.0000 mg | ORAL_TABLET | Freq: Three times a day (TID) | ORAL | 0 refills | Status: DC | PRN

## 2020-11-07 NOTE — Progress Notes (Signed)
Subjective:    Patient ID: Erik Taylor, male    DOB: 1955/12/09, 65 y.o.   MRN: [id number]  HPI: Erik Taylor is a 65 y.o. male who returns for follow up appointment for chronic pain and medication refill. He states his  pain is located in his left thumb and left index finger with numbness, neck pain radiating into his right shoulder and lower back pain radiating into his right buttock. He rates his pain 8. His  current exercise regime is walking and performing stretching exercises.  [name] Morphine equivalent is 90.00  MME.  Last UDS was Performed on 08/09/2020, it was consistent.    Pain Inventory Average Pain 7 Pain Right Now 8 My pain is sharp, burning, dull, stabbing, tingling and aching  In the last 24 hours, has pain interfered with the following? General activity 8 Relation with others 4 Enjoyment of life 7 What TIME of day is your pain at its worst? daytime Sleep (in general) NA  Pain is worse with: walking, bending, sitting, inactivity, standing and some activites Pain improves with: rest, heat/ice and medication Relief from Meds: 6  Family History  Problem Relation Age of Onset  . Stroke Mother   . Heart disease Father   . Heart disease Sister   . Kidney disease Brother    Social History   Socioeconomic History  . Marital status: Married    Spouse name: Not on file  . Number of children: Not on file  . Years of education: Not on file  . Highest education level: Not on file  Occupational History  . Not on file  Tobacco Use  . Smoking status: Former Smoker    Types: Cigarettes    Quit date: 02/01/1994    Years since quitting: 26.7  . Smokeless tobacco: Never Used  Vaping Use  . Vaping Use: Never used  Substance and Sexual Activity  . Alcohol use: Never  . Drug use: Never  . Sexual activity: Not on file  Other Topics Concern  . Not on file  Social History Narrative  . Not on file   Social Determinants of Health   Financial Resource  Strain: Not on file  Food Insecurity: Not on file  Transportation Needs: Not on file  Physical Activity: Not on file  Stress: Not on file  Social Connections: Not on file   Past Surgical History:  Procedure Laterality Date  . arm nere repair     nerve repair  . TONSILLECTOMY     Past Surgical History:  Procedure Laterality Date  . arm nere repair     nerve repair  . TONSILLECTOMY     Past Medical History:  Diagnosis Date  . Arthritis   . Diabetes mellitus without complication (HCC)   . Neuromuscular disorder (HCC)    BP 135/77   Pulse 68   Temp 97.8 F (36.6 C)   Ht 5\' 10"  (1.778 m)   Wt 204 lb (92.5 kg)   SpO2 97%   BMI 29.27 kg/m   Opioid Risk Score:   Fall Risk Score:  `1  Depression screen PHQ 2/9  Depression screen [facility] 2/9 10/04/2020 09/06/2020 08/09/2020 01/03/2020 11/24/2019 05/30/2018 03/30/2018  Decreased Interest 0 0 0 0 0 0 0  Down, Depressed, Hopeless 0 0 0 0 0 0 0  PHQ - 2 Score 0 0 0 0 0 0 0  Altered sleeping - - - - - - -  Tired, decreased energy - - - - - - -  Change in appetite - - - - - - -  Feeling bad or failure about yourself  - - - - - - -  Trouble concentrating - - - - - - -  Moving slowly or fidgety/restless - - - - - - -  Suicidal thoughts - - - - - - -  PHQ-9 Score - - - - - - -  Difficult doing work/chores - - - - - - -      Review of Systems  Constitutional: Negative.   HENT: Negative.   Eyes: Negative.   Respiratory: Negative.   Cardiovascular: Negative.   Gastrointestinal: Negative.   Endocrine: Negative.   Musculoskeletal: Positive for back pain and neck pain.       Left hand pain (numbness) right arm pain,  Skin: Negative.   Allergic/Immunologic: Negative.   Neurological: Positive for numbness.       On fingers   Hematological: Negative.   Psychiatric/Behavioral: Negative.        Objective:   Physical Exam Vitals and nursing note reviewed.  Constitutional:      Appearance: Normal appearance.  Neck:     Comments:  Cervical Paraspinal Tenderness: C-5-C-6 Cardiovascular:     Rate and Rhythm: Normal rate and regular rhythm.     Pulses: Normal pulses.     Heart sounds: Normal heart sounds.  Pulmonary:     Effort: Pulmonary effort is normal.     Breath sounds: Normal breath sounds.  Musculoskeletal:     Cervical back: Normal range of motion and neck supple.     Comments: Normal Muscle Bulk and Muscle Testing Reveals:  Upper Extremities: Right: Decreased ROM 45 Degrees and Muscle Strength  5/5 Right AC Joint Tenderness Left Upper Extremity: Full ROM and Muscle Strength 5/5 Lumbar Paraspinal Tenderness: L-3-L-5 Lower Extremities: Full ROM and Muscle Strength 5/5 Arises from chair with ease Narrow Based Gait   Skin:    General: Skin is warm and dry.  Neurological:     Mental Status: He is alert and oriented to person, place, and time.  Psychiatric:        Mood and Affect: Mood normal.        Behavior: Behavior normal.           Assessment & Plan:  1.Cervicalgia/Cervical Spondylosis with Radiculopathy: Continue HEP as Tolerated. RX: Gabapentin with instructions. One Capsule at HS only, send a My-Chart or call office on Monday 08/12/20, he verbalizes understanding.Topamaxdiscontinued ineffective he reports.:11/07/2020. 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 Continue HEP as Tolerated.ContinueGabapentin. 11/07/2020. 3. Injury of right shoulder and right upper arm. Continue HEP as Tolerated. Continue to Monitor.11/07/2020. 4. Chronic Pain Syndrome:Continue:Oxymorphone10 mg one tablet every 8 hours as needed for pain #90.11/07/2020. We will continue the opioid monitoring program, this consists of regular clinic visits, examinations, urine drug screen, pill counts as well as use of [place] Controlled Substance Reporting system. A 12 month History has been reviewed on the [place] Controlled Substance Reporting Systemon05/11/2020. 5.  Left Knee Pain:No Complaints Today.Continue HEP as Tolerated. Continue to Monitor.11/07/2020 6. Neuropathic Pain: Continue Gabapentin. Continue to Monitor.   F/U in 1 month.

## 2020-11-08 ENCOUNTER — Encounter: Payer: Medicare HMO | Admitting: Registered Nurse

## 2020-11-10 ENCOUNTER — Encounter: Payer: Self-pay | Admitting: Registered Nurse

## 2020-12-05 ENCOUNTER — Encounter: Payer: Medicare HMO | Attending: Physical Medicine & Rehabilitation | Admitting: Registered Nurse

## 2020-12-05 ENCOUNTER — Other Ambulatory Visit: Payer: Self-pay

## 2020-12-05 ENCOUNTER — Encounter: Payer: Self-pay | Admitting: Registered Nurse

## 2020-12-05 VITALS — BP 150/81 | HR 74 | Temp 98.3°F | Ht 70.0 in | Wt 206.8 lb

## 2020-12-05 DIAGNOSIS — M5412 Radiculopathy, cervical region: Secondary | ICD-10-CM | POA: Insufficient documentation

## 2020-12-05 DIAGNOSIS — G8929 Other chronic pain: Secondary | ICD-10-CM | POA: Diagnosis present

## 2020-12-05 DIAGNOSIS — Z5181 Encounter for therapeutic drug level monitoring: Secondary | ICD-10-CM | POA: Diagnosis present

## 2020-12-05 DIAGNOSIS — G894 Chronic pain syndrome: Secondary | ICD-10-CM | POA: Insufficient documentation

## 2020-12-05 DIAGNOSIS — M5416 Radiculopathy, lumbar region: Secondary | ICD-10-CM | POA: Diagnosis present

## 2020-12-05 DIAGNOSIS — M542 Cervicalgia: Secondary | ICD-10-CM | POA: Diagnosis present

## 2020-12-05 DIAGNOSIS — M545 Low back pain, unspecified: Secondary | ICD-10-CM | POA: Diagnosis present

## 2020-12-05 DIAGNOSIS — Z79891 Long term (current) use of opiate analgesic: Secondary | ICD-10-CM | POA: Insufficient documentation

## 2020-12-05 DIAGNOSIS — M4722 Other spondylosis with radiculopathy, cervical region: Secondary | ICD-10-CM | POA: Diagnosis present

## 2020-12-05 DIAGNOSIS — S4491XS Injury of unspecified nerve at shoulder and upper arm level, right arm, sequela: Secondary | ICD-10-CM | POA: Diagnosis present

## 2020-12-05 MED ORDER — OXYMORPHONE HCL 10 MG PO TABS: 10.0000 mg | ORAL_TABLET | Freq: Three times a day (TID) | ORAL | 0 refills | Status: DC | PRN

## 2020-12-05 MED ORDER — GABAPENTIN 100 MG PO CAPS: ORAL_CAPSULE | ORAL | 3 refills | Status: DC

## 2020-12-05 NOTE — Progress Notes (Signed)
Subjective:    Patient ID: Erik Taylor, male    DOB: 02-Dec-1955, 65 y.o.   MRN: [id number]  HPI: Erik Taylor is a 65 y.o. male who returns for follow up appointment for chronic pain and medication refill. He states his pain is located in his left thumb, left index finger and left hand with tingling and numbness. Also reports he has pain in his neck and lower back. He rates his pain 8. His current exercise regime is walking and performing stretching exercises.  [name] Morphine equivalent is 90.00 MME. UDS was Performed today.      Pain Inventory Average Pain 7 Pain Right Now 8 My pain is sharp, burning, dull, stabbing, tingling and aching  In the last 24 hours, has pain interfered with the following? General activity 7 Relation with others 5 Enjoyment of life 7 What TIME of day is your pain at its worst? daytime Sleep (in general) Poor  Pain is worse with: walking, bending, sitting, inactivity, standing and some activites Pain improves with: rest, heat/ice and medication Relief from Meds: 7  Family History  Problem Relation Age of Onset  . Stroke Mother   . Heart disease Father   . Heart disease Sister   . Kidney disease Brother    Social History   Socioeconomic History  . Marital status: Married    Spouse name: Not on file  . Number of children: Not on file  . Years of education: Not on file  . Highest education level: Not on file  Occupational History  . Not on file  Tobacco Use  . Smoking status: Former Smoker    Types: Cigarettes    Quit date: 02/01/1994    Years since quitting: 26.8  . Smokeless tobacco: Never Used  Vaping Use  . Vaping Use: Never used  Substance and Sexual Activity  . Alcohol use: Never  . Drug use: Never  . Sexual activity: Not on file  Other Topics Concern  . Not on file  Social History Narrative  . Not on file   Social Determinants of Health   Financial Resource Strain: Not on file  Food Insecurity: Not on file   Transportation Needs: Not on file  Physical Activity: Not on file  Stress: Not on file  Social Connections: Not on file   Past Surgical History:  Procedure Laterality Date  . arm nere repair     nerve repair  . TONSILLECTOMY     Past Surgical History:  Procedure Laterality Date  . arm nere repair     nerve repair  . TONSILLECTOMY     Past Medical History:  Diagnosis Date  . Arthritis   . Diabetes mellitus without complication (HCC)   . Neuromuscular disorder (HCC)    BP (!) 150/81   Pulse 74   Temp 98.3 F (36.8 C)   Ht 5\' 10"  (1.778 m)   Wt 206 lb 12.8 oz (93.8 kg)   SpO2 96%   BMI 29.67 kg/m   Opioid Risk Score:   Fall Risk Score:  `1  Depression screen PHQ 2/9  Depression screen [facility] 2/9 12/05/2020 11/07/2020 10/04/2020 09/06/2020 08/09/2020 01/03/2020 11/24/2019  Decreased Interest 1 1 0 0 0 0 0  Down, Depressed, Hopeless 1 1 0 0 0 0 0  PHQ - 2 Score 2 2 0 0 0 0 0  Altered sleeping - 1 - - - - -  Tired, decreased energy - - - - - - -  Change in appetite - - - - - - -  Feeling bad or failure about yourself  - - - - - - -  Trouble concentrating - - - - - - -  Moving slowly or fidgety/restless - - - - - - -  Suicidal thoughts - - - - - - -  PHQ-9 Score - 3 - - - - -  Difficult doing work/chores - - - - - - -    Review of Systems  Constitutional: Negative.   HENT: Negative.   Eyes: Negative.   Respiratory: Negative.   Cardiovascular: Negative.   Gastrointestinal: Negative.   Endocrine: Negative.   Genitourinary: Negative.   Musculoskeletal: Positive for back pain and neck pain.       Left hand thumb and forefinger  Skin: Negative.   Allergic/Immunologic: Negative.   Neurological: Positive for numbness.  Hematological: Negative.   Psychiatric/Behavioral: Negative.   All other systems reviewed and are negative.      Objective:   Physical Exam Vitals and nursing note reviewed.  Constitutional:      Appearance: Normal appearance.  Neck:     Comments:  Cervical Paraspinal Tenderness: C-5-C-6 Cardiovascular:     Rate and Rhythm: Normal rate and regular rhythm.     Pulses: Normal pulses.     Heart sounds: Normal heart sounds.  Pulmonary:     Effort: Pulmonary effort is normal.     Breath sounds: Normal breath sounds.  Musculoskeletal:     Cervical back: Normal range of motion and neck supple.     Comments: Normal Muscle Bulk and Muscle Testing Reveals:  Upper Extremities: Right: Decreased ROM 90 Degrees  and Muscle Strength 4/5 Right AC Joint Tenderness Left Upper extremity: Full ROM and Muscle Strength 5/5  Lumbar Paraspinal Tenderness: L-4-L-5 Lower Extremities: Full ROM and Muscle Strength 5/5 Arises from Chair with ease Narrow Based  Gait   Skin:    General: Skin is warm and dry.  Neurological:     Mental Status: He is alert and oriented to person, place, and time.  Psychiatric:        Mood and Affect: Mood normal.        Behavior: Behavior normal.           Assessment & Plan:  1.Cervicalgia/Cervical Spondylosis with Radiculopathy: Continue HEP as Tolerated. Continue Gabapentin. Topamaxdiscontinued ineffective he reports.:12/05/2020. 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 Continue HEP as Tolerated.ContinueGabapentin. 12/05/2020. 3. Injury of right shoulder and right upper arm. Continue HEP as Tolerated. Continue to Monitor.12/05/2020. 4. Chronic Pain Syndrome:Continue:Oxymorphone10 mg one tablet every 8 hours as needed for pain #90.12/05/2020. We will continue the opioid monitoring program, this consists of regular clinic visits, examinations, urine drug screen, pill counts as well as use of [place] Controlled Substance Reporting system. A 12 month History has been reviewed on the [place] Controlled Substance Reporting Systemon06/08/2020. 5. Left Knee Pain:No Complaints Today.Continue HEP as Tolerated. Continue to Monitor.12/05/2020 6. Neuropathic  Pain: Continue Gabapentin. Continue to Monitor. 12/05/2020  F/U in 1 month.

## 2020-12-05 NOTE — Patient Instructions (Signed)
Gabapentin 100 mg : One Capsule in the morning and Two Capsules at Bedtime:   Send a MY-Chart in a 1-2 weeks to evaluate medication change.

## 2020-12-06 ENCOUNTER — Ambulatory Visit: Payer: Medicare HMO | Admitting: Registered Nurse

## 2020-12-11 LAB — TOXASSURE SELECT,+ANTIDEPR,UR

## 2020-12-13 ENCOUNTER — Telehealth: Payer: Self-pay | Admitting: *Deleted

## 2020-12-13 NOTE — Telephone Encounter (Signed)
Urine drug screen for this encounter is consistent for prescribed medication 

## 2021-01-02 ENCOUNTER — Encounter: Payer: Self-pay | Admitting: Registered Nurse

## 2021-01-02 ENCOUNTER — Encounter (HOSPITAL_BASED_OUTPATIENT_CLINIC_OR_DEPARTMENT_OTHER): Payer: Medicare HMO | Admitting: Registered Nurse

## 2021-01-02 ENCOUNTER — Other Ambulatory Visit: Payer: Self-pay

## 2021-01-02 VITALS — BP 139/87 | HR 78 | Temp 97.9°F | Ht 70.0 in | Wt 200.0 lb

## 2021-01-02 DIAGNOSIS — M542 Cervicalgia: Secondary | ICD-10-CM | POA: Diagnosis not present

## 2021-01-02 DIAGNOSIS — M5412 Radiculopathy, cervical region: Secondary | ICD-10-CM | POA: Diagnosis not present

## 2021-01-02 DIAGNOSIS — G894 Chronic pain syndrome: Secondary | ICD-10-CM

## 2021-01-02 DIAGNOSIS — Z79891 Long term (current) use of opiate analgesic: Secondary | ICD-10-CM

## 2021-01-02 DIAGNOSIS — S4491XS Injury of unspecified nerve at shoulder and upper arm level, right arm, sequela: Secondary | ICD-10-CM | POA: Diagnosis not present

## 2021-01-02 DIAGNOSIS — M4722 Other spondylosis with radiculopathy, cervical region: Secondary | ICD-10-CM | POA: Diagnosis not present

## 2021-01-02 DIAGNOSIS — Z5181 Encounter for therapeutic drug level monitoring: Secondary | ICD-10-CM

## 2021-01-02 DIAGNOSIS — G8929 Other chronic pain: Secondary | ICD-10-CM

## 2021-01-02 DIAGNOSIS — M545 Low back pain, unspecified: Secondary | ICD-10-CM | POA: Diagnosis not present

## 2021-01-02 MED ORDER — OXYMORPHONE HCL 10 MG PO TABS: 10.0000 mg | ORAL_TABLET | Freq: Three times a day (TID) | ORAL | 0 refills | Status: DC | PRN

## 2021-01-02 NOTE — Patient Instructions (Addendum)
Increase Gabapentin at Bedtime: 3 Capsules at Bedtime:  = 300 mg of Gabapentin  Send a Message  in a week with update

## 2021-01-02 NOTE — Progress Notes (Signed)
Subjective:    Patient ID: Erik Taylor, male    DOB: 1955/10/01, 65 y.o.   MRN: [id number]  HPI: Erik Taylor is a 65 y.o. male who returns for follow up appointment for chronic pain and medication refill. He states his  pain is located in his neck radiating into his right shoulder and left hand with tingling and burning, also states his neuropathic pain is increasing in intensity and frequency. We will increase his gabapentin HS dose. Mr. Justen also states he has lower back pain. He rates his pain 8. His current exercise regime is walking and performing stretching exercises.  Ms. Struckman Morphine equivalent is 90.00 MME.   Last UDS was Performed on 12/05/2020, it was consistent.    Pain Inventory Average Pain 7 Pain Right Now 8 My pain is constant, sharp, burning, dull, stabbing, tingling, and aching  In the last 24 hours, has pain interfered with the following? General activity 7 Relation with others 4 Enjoyment of life 7 What TIME of day is your pain at its worst? daytime Sleep (in general) Poor  Pain is worse with: walking, bending, sitting, inactivity, standing, and some activites Pain improves with: rest, heat/ice, and medication Relief from Meds: 7  Family History  Problem Relation Age of Onset   Stroke Mother    Heart disease Father    Heart disease Sister    Kidney disease Brother    Social History   Socioeconomic History   Marital status: Married    Spouse name: Not on file   Number of children: Not on file   Years of education: Not on file   Highest education level: Not on file  Occupational History   Not on file  Tobacco Use   Smoking status: Former    Pack years: 0.00    Types: Cigarettes    Quit date: 02/01/1994    Years since quitting: 26.9   Smokeless tobacco: Never  Vaping Use   Vaping Use: Never used  Substance and Sexual Activity   Alcohol use: Never   Drug use: Never   Sexual activity: Not on file  Other Topics Concern   Not on  file  Social History Narrative   Not on file   Social Determinants of Health   Financial Resource Strain: Not on file  Food Insecurity: Not on file  Transportation Needs: Not on file  Physical Activity: Not on file  Stress: Not on file  Social Connections: Not on file   Past Surgical History:  Procedure Laterality Date   arm nere repair     nerve repair   TONSILLECTOMY     Past Surgical History:  Procedure Laterality Date   arm nere repair     nerve repair   TONSILLECTOMY     Past Medical History:  Diagnosis Date   Arthritis    Diabetes mellitus without complication (HCC)    Neuromuscular disorder (HCC)    There were no vitals taken for this visit.  Opioid Risk Score:   Fall Risk Score:  `1  Depression screen PHQ 2/9  Depression screen [facility] 2/9 12/05/2020 11/07/2020 10/04/2020 09/06/2020 08/09/2020 01/03/2020 11/24/2019  Decreased Interest 1 1 0 0 0 0 0  Down, Depressed, Hopeless 1 1 0 0 0 0 0  PHQ - 2 Score 2 2 0 0 0 0 0  Altered sleeping - 1 - - - - -  Tired, decreased energy - - - - - - -  Change in appetite - - - - - - -  Feeling bad or failure about yourself  - - - - - - -  Trouble concentrating - - - - - - -  Moving slowly or fidgety/restless - - - - - - -  Suicidal thoughts - - - - - - -  PHQ-9 Score - 3 - - - - -  Difficult doing work/chores - - - - - - -      Review of Systems  Constitutional: Negative.   HENT: Negative.    Eyes: Negative.   Respiratory: Negative.    Cardiovascular: Negative.   Gastrointestinal: Negative.   Endocrine: Negative.   Genitourinary: Negative.   Musculoskeletal:  Positive for arthralgias, back pain, neck pain and neck stiffness.  Skin: Negative.   Allergic/Immunologic: Negative.   Neurological:  Positive for weakness and numbness.       Tingling  Psychiatric/Behavioral: Negative.    All other systems reviewed and are negative.     Objective:   Physical Exam Vitals and nursing note reviewed.  Constitutional:       Appearance: Normal appearance.  Cardiovascular:     Rate and Rhythm: Normal rate and regular rhythm.     Pulses: Normal pulses.     Heart sounds: Normal heart sounds.  Pulmonary:     Effort: Pulmonary effort is normal.     Breath sounds: Normal breath sounds.  Musculoskeletal:     Cervical back: Normal range of motion and neck supple.     Comments: Normal Muscle Bulk and Muscle Testing Reveals:  Upper Extremities: Right: Decreased ROM  45 Degrees and Muscle Strength 3/5 Left Upper Extremity: Full ROM and Muscle Strength 5/5 Lumbar Paraspinal Tenderness: L-4-L-5 Lower Extremities: Full ROM and Muscle Strength 5/5 Arises from chair with ease Narrow Based  Gait     Skin:    General: Skin is warm and dry.  Neurological:     Mental Status: He is alert and oriented to person, place, and time.  Psychiatric:        Mood and Affect: Mood normal.        Behavior: Behavior normal.         Assessment & Plan:  1.Cervicalgia/  Cervical Spondylosis with Radiculopathy: Continue HEP as Tolerated. Continue Gabapentin. Topamax discontinued ineffective he reports. : 01/02/2021. 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 Continue HEP as Tolerated . Continue Gabapentin. 01/02/2021. 3. Injury of right shoulder and right  upper arm. Continue HEP as Tolerated. Continue to Monitor. 01/02/2021. 4. Chronic Pain Syndrome:Continue: Oxymorphone 10 mg one tablet every 8 hours as needed for pain #90. 01/02/2021. We will continue the opioid monitoring program, this consists of regular clinic visits, examinations, urine drug screen, pill counts as well as use of [place] Controlled Substance Reporting system. A 12 month History has been reviewed on the [place] Controlled Substance Reporting System on 01/02/2021. 5. Left Knee Pain: No Complaints Today. Continue HEP as Tolerated. Continue to Monitor. 12/05/2020 6. Neuropathic Pain: Continue Gabapentin. And increase  Gabapentin HS dose to 300 mg  Send a My-Chart message with a update on medication change in 1-2 weeks, he verbalizes understanding. He would like to speak with Dr Riley Kill regarding a EMG, he has a scheduled appointment with Dr Riley Kill, he verbalizes understanding. Continue to Monitor. 01/02/2021   F/U in 1 month.

## 2021-01-03 ENCOUNTER — Ambulatory Visit: Payer: Medicare HMO | Admitting: Registered Nurse

## 2021-01-31 ENCOUNTER — Other Ambulatory Visit: Payer: Self-pay

## 2021-01-31 ENCOUNTER — Encounter: Payer: Self-pay | Admitting: Registered Nurse

## 2021-01-31 ENCOUNTER — Encounter: Payer: Medicare HMO | Attending: Physical Medicine & Rehabilitation | Admitting: Registered Nurse

## 2021-01-31 VITALS — BP 125/82 | HR 86 | Temp 99.0°F | Ht 70.0 in | Wt 201.6 lb

## 2021-01-31 DIAGNOSIS — G8929 Other chronic pain: Secondary | ICD-10-CM | POA: Diagnosis present

## 2021-01-31 DIAGNOSIS — M5412 Radiculopathy, cervical region: Secondary | ICD-10-CM | POA: Diagnosis present

## 2021-01-31 DIAGNOSIS — M4722 Other spondylosis with radiculopathy, cervical region: Secondary | ICD-10-CM

## 2021-01-31 DIAGNOSIS — M545 Low back pain, unspecified: Secondary | ICD-10-CM

## 2021-01-31 DIAGNOSIS — G894 Chronic pain syndrome: Secondary | ICD-10-CM

## 2021-01-31 DIAGNOSIS — M542 Cervicalgia: Secondary | ICD-10-CM

## 2021-01-31 DIAGNOSIS — Z5181 Encounter for therapeutic drug level monitoring: Secondary | ICD-10-CM

## 2021-01-31 DIAGNOSIS — S4491XS Injury of unspecified nerve at shoulder and upper arm level, right arm, sequela: Secondary | ICD-10-CM

## 2021-01-31 DIAGNOSIS — Z79891 Long term (current) use of opiate analgesic: Secondary | ICD-10-CM

## 2021-01-31 MED ORDER — OXYMORPHONE HCL 10 MG PO TABS: 10.0000 mg | ORAL_TABLET | Freq: Three times a day (TID) | ORAL | 0 refills | Status: DC | PRN

## 2021-01-31 NOTE — Progress Notes (Signed)
Subjective:    Patient ID: Erik Taylor, male    DOB: 02/03/1956, 65 y.o.   MRN: [id number]  HPI: DEMARRI Taylor is a 65 y.o. male who returns for follow up appointment for chronic pain and medication refill. He states his pain is located in his neck radiating into his right shoulder, right arm and right hand with tingling and burning. He rates his pain 9. His current exercise regime is walking and performing stretching exercises.   Erik Taylor Morphine equivalent is 90.00 MME.   Last UDS was Performed on 12/05/2020, it was consistent.    Pain Inventory Average Pain 7 Pain Right Now 9 My pain is constant, sharp, burning, dull, and tingling  In the last 24 hours, has pain interfered with the following? General activity 8 Relation with others 5 Enjoyment of life 6 What TIME of day is your pain at its worst? daytime Sleep (in general) Poor  Pain is worse with: walking and some activites Pain improves with: rest, heat/ice, and medication Relief from Meds: 7       Family History  Problem Relation Age of Onset   Stroke Mother    Heart disease Father    Heart disease Sister    Kidney disease Brother    Social History   Socioeconomic History   Marital status: Married    Spouse name: Not on file   Number of children: Not on file   Years of education: Not on file   Highest education level: Not on file  Occupational History   Not on file  Tobacco Use   Smoking status: Former    Types: Cigarettes    Quit date: 02/01/1994    Years since quitting: 27.0   Smokeless tobacco: Never  Vaping Use   Vaping Use: Never used  Substance and Sexual Activity   Alcohol use: Never   Drug use: Never   Sexual activity: Not on file  Other Topics Concern   Not on file  Social History Narrative   Not on file   Social Determinants of Health   Financial Resource Strain: Not on file  Food Insecurity: Not on file  Transportation Needs: Not on file  Physical Activity: Not on file   Stress: Not on file  Social Connections: Not on file   Past Surgical History:  Procedure Laterality Date   arm nere repair     nerve repair   TONSILLECTOMY     Past Medical History:  Diagnosis Date   Arthritis    Diabetes mellitus without complication (HCC)    Neuromuscular disorder (HCC)    BP 125/82   Pulse 86   Temp 99 F (37.2 C)   Ht 5\' 10"  (1.778 m)   Wt 201 lb 9.6 oz (91.4 kg)   SpO2 98%   BMI 28.93 kg/m   Opioid Risk Score:   Fall Risk Score:  `1  Depression screen PHQ 2/9  Depression screen [facility] 2/9 12/05/2020 11/07/2020 10/04/2020 09/06/2020 08/09/2020 01/03/2020 11/24/2019  Decreased Interest 1 1 0 0 0 0 0  Down, Depressed, Hopeless 1 1 0 0 0 0 0  PHQ - 2 Score 2 2 0 0 0 0 0  Altered sleeping - 1 - - - - -  Tired, decreased energy - - - - - - -  Change in appetite - - - - - - -  Feeling bad or failure about yourself  - - - - - - -  Trouble concentrating - - - - - - -  Moving slowly or fidgety/restless - - - - - - -  Suicidal thoughts - - - - - - -  PHQ-9 Score - 3 - - - - -  Difficult doing work/chores - - - - - - -    Review of Systems  Musculoskeletal:  Positive for back pain and neck pain.       Pain in shoulders  All other systems reviewed and are negative.     Objective:   Physical Exam Vitals and nursing note reviewed.  Constitutional:      Appearance: Normal appearance.  Cardiovascular:     Rate and Rhythm: Normal rate and regular rhythm.     Pulses: Normal pulses.     Heart sounds: Normal heart sounds.  Pulmonary:     Effort: Pulmonary effort is normal.     Breath sounds: Normal breath sounds.  Musculoskeletal:     Cervical back: Normal range of motion and neck supple.     Comments: Normal Muscle Bulk and Muscle Testing Reveals:  Upper Extremities: Decreased ROM 90 Degrees and Muscle Strength 5/5 Right AC Joint Tenderness  Thoracic Paraspinal Tenderness: T-7-T-9 Lumbar Hypersensitivity Lower Extremities: Full ROM and Muscle Strength  5/5 Arises from chair with ease Narrow Based  Gait     Skin:    General: Skin is warm and dry.  Neurological:     Mental Status: He is alert and oriented to person, place, and time.  Psychiatric:        Mood and Affect: Mood normal.        Behavior: Behavior normal.         Assessment & Plan:  1.Cervicalgia/  Cervical Spondylosis with Radiculopathy: Continue HEP as Tolerated. Continue Gabapentin. Topamax discontinued ineffective he reports. : 01/31/2021. 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 Continue HEP as Tolerated . Continue Gabapentin. 01/31/2021. 3. Injury of right shoulder and right  upper arm. Continue HEP as Tolerated. Continue to Monitor. 01/31/2021. 4. Chronic Pain Syndrome:Continue: Oxymorphone 10 mg one tablet every 8 hours as needed for pain #90. 01/31/2021. We will continue the opioid monitoring program, this consists of regular clinic visits, examinations, urine drug screen, pill counts as well as use of [place] Controlled Substance Reporting system. A 12 month History has been reviewed on the [place] Controlled Substance Reporting System on 01/31/2021. 5. Left Knee Pain: No Complaints Today. Continue HEP as Tolerated. Continue to Monitor. 01/31/2021 6. Neuropathic Pain: Continue Gabapentin. He would like to speak with Erik Taylor regarding a EMG, he has a scheduled appointment with Erik Taylor, he verbalizes understanding. Continue to Monitor. 01/31/2021   F/U in 1 month.

## 2021-02-28 ENCOUNTER — Encounter: Payer: Self-pay | Admitting: Registered Nurse

## 2021-02-28 ENCOUNTER — Encounter: Payer: Medicare HMO | Attending: Physical Medicine & Rehabilitation | Admitting: Registered Nurse

## 2021-02-28 ENCOUNTER — Other Ambulatory Visit: Payer: Self-pay

## 2021-02-28 VITALS — BP 133/86 | HR 80 | Ht 70.0 in | Wt 184.0 lb

## 2021-02-28 DIAGNOSIS — M5412 Radiculopathy, cervical region: Secondary | ICD-10-CM

## 2021-02-28 DIAGNOSIS — S4491XS Injury of unspecified nerve at shoulder and upper arm level, right arm, sequela: Secondary | ICD-10-CM

## 2021-02-28 DIAGNOSIS — M4722 Other spondylosis with radiculopathy, cervical region: Secondary | ICD-10-CM | POA: Diagnosis not present

## 2021-02-28 DIAGNOSIS — M792 Neuralgia and neuritis, unspecified: Secondary | ICD-10-CM

## 2021-02-28 DIAGNOSIS — G8929 Other chronic pain: Secondary | ICD-10-CM

## 2021-02-28 DIAGNOSIS — M545 Low back pain, unspecified: Secondary | ICD-10-CM

## 2021-02-28 DIAGNOSIS — M542 Cervicalgia: Secondary | ICD-10-CM | POA: Diagnosis not present

## 2021-02-28 MED ORDER — OXYMORPHONE HCL 10 MG PO TABS: 10.0000 mg | ORAL_TABLET | Freq: Three times a day (TID) | ORAL | 0 refills | Status: DC | PRN

## 2021-02-28 NOTE — Addendum Note (Signed)
Addended by: Jones Bales on: 02/28/2021 11:32 AM   Modules accepted: Level of Service

## 2021-02-28 NOTE — Progress Notes (Addendum)
Subjective:    Patient ID: Erik Taylor, male    DOB: 12-26-1955, 65 y.o.   MRN: [id number]  HPI: Erik Taylor is a 65 y.o. male whose appointment was changed to a telephone visit, he called the office he was diagnosed with COVID, his PCP is following.. Erik Taylor agrees with the telephone visit and verbalizes understanding. Marland KitchenHe states his  pain is located in his neck radiating into his right shoulder and right arm with tingling and numbness, lower back pain and left hand with numbness and tingling. He rates his pain 9. His current exercise regime is walking short distances at this time.  Erik Taylor Morphine equivalent is 90.00 MME.   Last UDS was Performed on 12/05/2020, it was consistent.     Pain Inventory Average Pain 9 Pain Right Now 9 My pain is constant, sharp, burning, dull, stabbing, tingling, and aching  In the last 24 hours, has pain interfered with the following? General activity 8 Relation with others 5 Enjoyment of life 6 What TIME of day is your pain at its worst? morning , daytime, evening, and night Sleep (in general) Poor  Pain is worse with: walking and some activites Pain improves with: rest, heat/ice, and medication Relief from Meds: 7  Family History  Problem Relation Age of Onset   Stroke Mother    Heart disease Father    Heart disease Sister    Kidney disease Brother    Social History   Socioeconomic History   Marital status: Married    Spouse name: Not on file   Number of children: Not on file   Years of education: Not on file   Highest education level: Not on file  Occupational History   Not on file  Tobacco Use   Smoking status: Former    Types: Cigarettes    Quit date: 02/01/1994    Years since quitting: 27.0   Smokeless tobacco: Never  Vaping Use   Vaping Use: Never used  Substance and Sexual Activity   Alcohol use: Never   Drug use: Never   Sexual activity: Not on file  Other Topics Concern   Not on file  Social History  Narrative   Not on file   Social Determinants of Health   Financial Resource Strain: Not on file  Food Insecurity: Not on file  Transportation Needs: Not on file  Physical Activity: Not on file  Stress: Not on file  Social Connections: Not on file   Past Surgical History:  Procedure Laterality Date   arm nere repair     nerve repair   TONSILLECTOMY     Past Surgical History:  Procedure Laterality Date   arm nere repair     nerve repair   TONSILLECTOMY     Past Medical History:  Diagnosis Date   Arthritis    Diabetes mellitus without complication (HCC)    Neuromuscular disorder (HCC)    Ht 5\' 10"  (1.778 m)   Wt 184 lb (83.5 kg) Comment: reported  BMI 26.40 kg/m   Opioid Risk Score:   Fall Risk Score:  `1  Depression screen PHQ 2/9  Depression screen [facility] 2/9 02/28/2021 01/31/2021 12/05/2020 11/07/2020 10/04/2020 09/06/2020 08/09/2020  Decreased Interest 1 1 1 1  0 0 0  Down, Depressed, Hopeless 1 1 1 1  0 0 0  PHQ - 2 Score 2 2 2 2  0 0 0  Altered sleeping - - - 1 - - -  Tired, decreased energy - - - - - - -  Change in appetite - - - - - - -  Feeling bad or failure about yourself  - - - - - - -  Trouble concentrating - - - - - - -  Moving slowly or fidgety/restless - - - - - - -  Suicidal thoughts - - - - - - -  PHQ-9 Score - - - 3 - - -  Difficult doing work/chores - - - - - - -     Review of Systems  Constitutional:  Positive for fatigue.  HENT: Negative.    Eyes: Negative.   Respiratory:  Positive for cough and shortness of breath.        Congestion  Cardiovascular: Negative.   Gastrointestinal: Negative.   Endocrine: Negative.   Genitourinary: Negative.   Musculoskeletal:  Positive for back pain.  Skin: Negative.   Allergic/Immunologic: Negative.   Neurological:  Positive for weakness.  Hematological: Negative.   Psychiatric/Behavioral:  Positive for dysphoric mood.   All other systems reviewed and are negative.     Objective:   Physical Exam Vitals and  nursing note reviewed.  Musculoskeletal:     Comments: No Physical Exam Performed Today: Telephone Visit         Assessment & Plan:  1.Cervicalgia/  Cervical Spondylosis with Radiculopathy: Continue HEP as Tolerated. Continue Gabapentin. Topamax discontinued ineffective he reports. : 02/28/2021. 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 Continue HEP as Tolerated . Continue Gabapentin. 02/28/2021. 3. Injury of right shoulder and right  upper arm. Continue HEP as Tolerated. Continue to Monitor. 02/28/2021. 4. Chronic Pain Syndrome:Continue: Oxymorphone 10 mg one tablet every 8 hours as needed for pain #90. 02/28/2021. We will continue the opioid monitoring program, this consists of regular clinic visits, examinations, urine drug screen, pill counts as well as use of [place] Controlled Substance Reporting system. A 12 month History has been reviewed on the [place] Controlled Substance Reporting System on 02/28/2021. 5. Left Knee Pain: No Complaints Today. Continue HEP as Tolerated. Continue to Monitor. 02/28/2021 6. Neuropathic Pain: Continue Gabapentin. He would like to speak with Dr Riley Kill regarding a EMG, he has a scheduled appointment with Dr Riley Kill next month, he verbalizes understanding. Continue to Monitor. 02/28/2021   F/U in 1 month.  Telephone Visit Establish Patient Location of Patient: In his Home Location of Provider: In the Office Total Time Spent: 10 Minutes

## 2021-03-06 DEATH — deceased

## 2021-03-26 ENCOUNTER — Encounter: Payer: Self-pay | Admitting: Physical Medicine & Rehabilitation

## 2021-03-26 ENCOUNTER — Other Ambulatory Visit: Payer: Self-pay

## 2021-03-26 ENCOUNTER — Encounter: Payer: Medicare HMO | Attending: Physical Medicine & Rehabilitation | Admitting: Physical Medicine & Rehabilitation

## 2021-03-26 VITALS — BP 129/84 | HR 75 | Temp 99.1°F | Ht 70.0 in | Wt 201.0 lb

## 2021-03-26 DIAGNOSIS — S4491XS Injury of unspecified nerve at shoulder and upper arm level, right arm, sequela: Secondary | ICD-10-CM | POA: Diagnosis present

## 2021-03-26 DIAGNOSIS — M4722 Other spondylosis with radiculopathy, cervical region: Secondary | ICD-10-CM

## 2021-03-26 DIAGNOSIS — G5602 Carpal tunnel syndrome, left upper limb: Secondary | ICD-10-CM | POA: Diagnosis present

## 2021-03-26 MED ORDER — LAMOTRIGINE 25 MG PO TABS: 25.0000 mg | ORAL_TABLET | Freq: Two times a day (BID) | ORAL | 2 refills | Status: DC

## 2021-03-26 MED ORDER — OXYMORPHONE HCL 10 MG PO TABS: 10.0000 mg | ORAL_TABLET | Freq: Four times a day (QID) | ORAL | 0 refills | Status: DC | PRN

## 2021-03-26 NOTE — Patient Instructions (Signed)
PLEASE FEEL FREE TO CALL OUR OFFICE WITH ANY PROBLEMS OR QUESTIONS ([phone])      

## 2021-03-26 NOTE — Progress Notes (Signed)
Subjective:    Patient ID: Erik Taylor, male    DOB: [birth year], 65 y.o.   MRN: [id number]  HPI  Erik Taylor is here in follow-up of his chronic pain.  He last saw him 2 years ago.  At that time he was reporting pain in his neck with radiation into the right shoulder and arm.  He was taking topiramate for this nerve pain and this seemed to be helping his pain control as far as the nerve pain was concerned.  He also has history of lumbar spondylosis with radiculopathy and injury of the right shoulder right upper arm.  Apparently he was recently diagnosed with COVID.  His last visit with our nurse practitioner was a televisit.  He reported pain in his neck rating into the right shoulder and right arm.  For pain has been receiving oxymorphone 10 mg every 8 hours as needed for pain.  He is also taking gabapentin for the radicular pain.  Over the last couple months he is reporting tingling burning pain in the left arm and hand.  This predominantly over the thumb index and middle fingers.  He reports that his fingernail is very sensitive to touch.  There are some symptoms over the dorsal aspect of the hand as well on the radial side as well as up into the wrist area.  He denies frank weakness at this point.  He is right-handed.  Right upper extremity symptoms are about the same.  His last cervical MRI from 8.18: C4-5: 2 mm broad-based disc bulge, uncovertebral hypertrophy. Moderate RIGHT, mild LEFT facet arthropathy. Minimal canal stenosis. Moderate to severe RIGHT, moderate LEFT neural foraminal narrowing.   C5-6: 3 mm broad-based disc bulge asymmetric to the RIGHT, uncovertebral hypertrophy and mild facet arthropathy. Mild canal stenosis. Severe RIGHT, moderate to severe LEFT neural foraminal narrowing.   C6-7: 5 mm broad-based disc bulge/ central disc protrusion. Uncovertebral hypertrophy and mild facet arthropathy. Moderate canal stenosis. Moderate to severe RIGHT, severe LEFT neural  foraminal narrowing.  He has not followed up with neurosurgery recently.  He reports that gabapentin really is not helping his pain.  He takes 400 mg at night and 100 mg in the morning.  More than the 100 mg during the day makes him groggy.  Topamax tends to do the same.  He tells me he resumed the Topamax although I did not see record of that.  He has been on Lyrica before which also made him sleepy.  He is taking Opana 10 mg every 8 hours as needed for pain.  Pain can wake him up at night while he sleeping.  Pain Inventory Average Pain 8 Pain Right Now 8 My pain is constant, sharp, burning, dull, stabbing, tingling, and aching  In the last 24 hours, has pain interfered with the following? General activity 7 Relation with others 5 Enjoyment of life 6 What TIME of day is your pain at its worst? daytime Sleep (in general) Poor  Pain is worse with: walking, bending, sitting, inactivity, standing, and some activites Pain improves with: rest, heat/ice, and medication Relief from Meds: 7  Family History  Problem Relation Age of Onset   Stroke Mother    Heart disease Father    Heart disease Sister    Kidney disease Brother    Social History   Socioeconomic History   Marital status: Married    Spouse name: Not on file   Number of children: Not on file   Years of education:  Not on file   Highest education level: Not on file  Occupational History   Not on file  Tobacco Use   Smoking status: Former    Types: Cigarettes    Quit date: 02/01/1994    Years since quitting: 27.1   Smokeless tobacco: Never  Vaping Use   Vaping Use: Never used  Substance and Sexual Activity   Alcohol use: Never   Drug use: Never   Sexual activity: Not on file  Other Topics Concern   Not on file  Social History Narrative   Not on file   Social Determinants of Health   Financial Resource Strain: Not on file  Food Insecurity: Not on file  Transportation Needs: Not on file  Physical Activity:  Not on file  Stress: Not on file  Social Connections: Not on file   Past Surgical History:  Procedure Laterality Date   arm nere repair     nerve repair   TONSILLECTOMY     Past Surgical History:  Procedure Laterality Date   arm nere repair     nerve repair   TONSILLECTOMY     Past Medical History:  Diagnosis Date   Arthritis    Diabetes mellitus without complication (HCC)    Neuromuscular disorder (HCC)    BP 129/84   Pulse 75   Temp 99.1 F (37.3 C)   Ht 5\' 10"  (1.778 m)   Wt 201 lb (91.2 kg)   SpO2 98%   BMI 28.84 kg/m   Opioid Risk Score:   Fall Risk Score:  `1  Depression screen PHQ 2/9  Depression screen [facility] 2/9 02/28/2021 01/31/2021 12/05/2020 11/07/2020 10/04/2020 09/06/2020 08/09/2020  Decreased Interest 1 1 1 1  0 0 0  Down, Depressed, Hopeless 1 1 1 1  0 0 0  PHQ - 2 Score 2 2 2 2  0 0 0  Altered sleeping - - - 1 - - -  Tired, decreased energy - - - - - - -  Change in appetite - - - - - - -  Feeling bad or failure about yourself  - - - - - - -  Trouble concentrating - - - - - - -  Moving slowly or fidgety/restless - - - - - - -  Suicidal thoughts - - - - - - -  PHQ-9 Score - - - 3 - - -  Difficult doing work/chores - - - - - - -    Review of Systems  Musculoskeletal:  Positive for back pain and neck pain.       Right shoulder, right arm pain, pain in both hands  Neurological:  Positive for numbness.       Tingling & numbness in hands & fingers  All other systems reviewed and are negative.     Objective:   Physical Exam General: No acute distress HEENT: EOMI, oral membranes moist Cards: reg rate  Chest: normal effort Abdomen: Soft, NT, ND Skin: dry, intact Extremities: no edema Musculoskeletal: functional ROM in neck. Tender along mid to lower neck with palpation. +impingement signs right shoulder.    Neurological: He is alert and oriented to person, place, and time. RUE limited due to pain. Strength generally 4-5/5 Skin: Skin is warm and dry.   Psychiatric:  Pleasant and cooperative              Assessment & Plan:  1. Cervical Spondylosis with Radiculopathy:  Continue  topiramate at HS            -  Dr Lovell Sheehan is following him from a NS standpoint-he may need further follow-up with him.  -Given his new symptoms in the left upper extremity we will set him up for a cervical MRI as well as nerve conduction studies of the left upper extremity  -Advised use of a neutral wrist splint for the left upper extremity night and day 2. Lumbar Spondylosis with Radiculopathy.  Lamictal trial as below  3. Injury of right shoulder and right  upper arm. Continue HEP as Tolerated.       4. Chronic Pain Syndrome: Continue Opana 10 mg immediate release but increased to every 6 hours as needed #120.           We will continue the controlled substance monitoring program, this consists of regular clinic visits, examinations, routine drug screening, pill counts as well as use of [place] Controlled Substance Reporting System. [place] today.    -Discontinue gabapentin and Topamax begin trial of Lamictal 25 mg twice daily   15 minutes of face to face patient care time was spent during this visit. All questions were encouraged and answered.  We will see him back in about 2 months for nerve conduction study.

## 2021-03-27 ENCOUNTER — Telehealth: Payer: Self-pay

## 2021-03-27 ENCOUNTER — Other Ambulatory Visit: Payer: Self-pay | Admitting: Physical Medicine & Rehabilitation

## 2021-03-27 DIAGNOSIS — G5602 Carpal tunnel syndrome, left upper limb: Secondary | ICD-10-CM

## 2021-03-27 DIAGNOSIS — M4722 Other spondylosis with radiculopathy, cervical region: Secondary | ICD-10-CM

## 2021-03-27 DIAGNOSIS — S4491XS Injury of unspecified nerve at shoulder and upper arm level, right arm, sequela: Secondary | ICD-10-CM

## 2021-03-27 NOTE — Telephone Encounter (Signed)
Patient called #120 of Oxymorphone was prescribed to him yesterday but the pharmacy only had #100 so he took the #100 and forfeited the other #20. Can a prescription for #20 be sent into pharmacy for when they receive more.

## 2021-03-28 MED ORDER — OXYMORPHONE HCL 10 MG PO TABS: 10.0000 mg | ORAL_TABLET | Freq: Four times a day (QID) | ORAL | 0 refills | Status: DC | PRN

## 2021-03-28 NOTE — Telephone Encounter (Signed)
Rx written for #20

## 2021-04-09 ENCOUNTER — Other Ambulatory Visit: Payer: Self-pay

## 2021-04-09 ENCOUNTER — Ambulatory Visit
Admission: RE | Admit: 2021-04-09 | Discharge: 2021-04-09 | Disposition: A | Discharge status: Home / Self Care | Payer: Medicare HMO | Source: Ambulatory Visit | Attending: Physical Medicine & Rehabilitation | Admitting: Physical Medicine & Rehabilitation

## 2021-04-09 DIAGNOSIS — M4722 Other spondylosis with radiculopathy, cervical region: Secondary | ICD-10-CM

## 2021-04-09 DIAGNOSIS — G5602 Carpal tunnel syndrome, left upper limb: Secondary | ICD-10-CM

## 2021-04-18 ENCOUNTER — Telehealth (HOSPITAL_COMMUNITY): Payer: Self-pay | Admitting: Physical Medicine & Rehabilitation

## 2021-04-18 DIAGNOSIS — G5602 Carpal tunnel syndrome, left upper limb: Secondary | ICD-10-CM

## 2021-04-18 DIAGNOSIS — M4722 Other spondylosis with radiculopathy, cervical region: Secondary | ICD-10-CM

## 2021-04-18 MED ORDER — LAMOTRIGINE 25 MG PO TABS: 50.0000 mg | ORAL_TABLET | Freq: Two times a day (BID) | ORAL | 0 refills | Status: DC

## 2021-04-18 NOTE — Telephone Encounter (Signed)
Reviewed MRI findings with Mr. Grauberger. Increased trileptal to 50mg  bid. EMG/NCS scheduled

## 2021-04-25 ENCOUNTER — Other Ambulatory Visit: Payer: Self-pay

## 2021-04-25 ENCOUNTER — Encounter: Payer: Medicare HMO | Attending: Physical Medicine & Rehabilitation | Admitting: Registered Nurse

## 2021-04-25 ENCOUNTER — Encounter: Payer: Self-pay | Admitting: Registered Nurse

## 2021-04-25 VITALS — BP 165/81 | HR 71 | Ht 70.0 in | Wt 202.8 lb

## 2021-04-25 DIAGNOSIS — M545 Low back pain, unspecified: Secondary | ICD-10-CM | POA: Diagnosis present

## 2021-04-25 DIAGNOSIS — M5412 Radiculopathy, cervical region: Secondary | ICD-10-CM | POA: Diagnosis present

## 2021-04-25 DIAGNOSIS — M542 Cervicalgia: Secondary | ICD-10-CM | POA: Insufficient documentation

## 2021-04-25 DIAGNOSIS — M4722 Other spondylosis with radiculopathy, cervical region: Secondary | ICD-10-CM | POA: Diagnosis present

## 2021-04-25 DIAGNOSIS — G8929 Other chronic pain: Secondary | ICD-10-CM | POA: Diagnosis present

## 2021-04-25 DIAGNOSIS — G894 Chronic pain syndrome: Secondary | ICD-10-CM | POA: Diagnosis not present

## 2021-04-25 DIAGNOSIS — Z5181 Encounter for therapeutic drug level monitoring: Secondary | ICD-10-CM | POA: Insufficient documentation

## 2021-04-25 DIAGNOSIS — Z79891 Long term (current) use of opiate analgesic: Secondary | ICD-10-CM | POA: Insufficient documentation

## 2021-04-25 DIAGNOSIS — S4491XS Injury of unspecified nerve at shoulder and upper arm level, right arm, sequela: Secondary | ICD-10-CM | POA: Diagnosis present

## 2021-04-25 MED ORDER — OXYMORPHONE HCL 10 MG PO TABS: 10.0000 mg | ORAL_TABLET | Freq: Four times a day (QID) | ORAL | 0 refills | Status: DC | PRN

## 2021-04-25 NOTE — Progress Notes (Signed)
Subjective:    Patient ID: WHEELER INCORVAIA, male    DOB: 14-Oct-1955, 65 y.o.   MRN: [id number]  HPI: Erik Taylor is a 65 y.o. male who returns for follow up appointment for chronic pain and medication refill. He states his pain is located in his neck radiating into his right shoulder and right arm with tingling.  He rates his pain 8. His current exercise regime is walking and performing stretching exercises.  Erik Taylor Morphine equivalent is 132.00 MME.   UDS was Performed Today.     Pain Inventory Average Pain 7 Pain Right Now 8 My pain is sharp, burning, dull, stabbing, tingling, and aching  In the last 24 hours, has pain interfered with the following? General activity 7 Relation with others 5 Enjoyment of life 6 What TIME of day is your pain at its worst? daytime Sleep (in general) Poor  Pain is worse with: walking, bending, sitting, inactivity, standing, and some activites Pain improves with: rest, heat/ice, and medication Relief from Meds: 7  Family History  Problem Relation Age of Onset   Stroke Mother    Heart disease Father    Heart disease Sister    Kidney disease Brother    Social History   Socioeconomic History   Marital status: Married    Spouse name: Not on file   Number of children: Not on file   Years of education: Not on file   Highest education level: Not on file  Occupational History   Not on file  Tobacco Use   Smoking status: Former    Types: Cigarettes    Quit date: 02/01/1994    Years since quitting: 27.2   Smokeless tobacco: Never  Vaping Use   Vaping Use: Never used  Substance and Sexual Activity   Alcohol use: Never   Drug use: Never   Sexual activity: Not on file  Other Topics Concern   Not on file  Social History Narrative   Not on file   Social Determinants of Health   Financial Resource Strain: Not on file  Food Insecurity: Not on file  Transportation Needs: Not on file  Physical Activity: Not on file  Stress: Not  on file  Social Connections: Not on file   Past Surgical History:  Procedure Laterality Date   arm nere repair     nerve repair   TONSILLECTOMY     Past Surgical History:  Procedure Laterality Date   arm nere repair     nerve repair   TONSILLECTOMY     Past Medical History:  Diagnosis Date   Arthritis    Diabetes mellitus without complication (HCC)    Neuromuscular disorder (HCC)    BP (!) 165/81   Pulse 71   Ht 5\' 10"  (1.778 m)   Wt 202 lb 12.8 oz (92 kg)   SpO2 96%   BMI 29.10 kg/m   Opioid Risk Score:   Fall Risk Score:  `1  Depression screen PHQ 2/9  Depression screen [facility] 2/9 04/25/2021 03/26/2021 02/28/2021 01/31/2021 12/05/2020 11/07/2020 10/04/2020  Decreased Interest 1 0 1 1 1 1  0  Down, Depressed, Hopeless 1 0 1 1 1 1  0  PHQ - 2 Score 2 0 2 2 2 2  0  Altered sleeping - - - - - 1 -  Tired, decreased energy - - - - - - -  Change in appetite - - - - - - -  Feeling bad or failure about yourself  - - - - - - -  Trouble concentrating - - - - - - -  Moving slowly or fidgety/restless - - - - - - -  Suicidal thoughts - - - - - - -  PHQ-9 Score - - - - - 3 -  Difficult doing work/chores - - - - - - -     Review of Systems  Constitutional: Negative.   HENT: Negative.    Eyes: Negative.   Respiratory: Negative.    Cardiovascular: Negative.   Gastrointestinal: Negative.   Endocrine: Negative.   Genitourinary: Negative.   Musculoskeletal:  Positive for back pain and neck pain.  Skin: Negative.   Allergic/Immunologic: Negative.   Neurological:  Positive for numbness.  Hematological: Negative.   Psychiatric/Behavioral: Negative.    All other systems reviewed and are negative.     Objective:   Physical Exam Vitals and nursing note reviewed.  Constitutional:      Appearance: Normal appearance.  Neck:     Comments: Cervical Paraspinal Tenderness: C-5-C-6 Cardiovascular:     Rate and Rhythm: Normal rate and regular rhythm.     Pulses: Normal pulses.     Heart  sounds: Normal heart sounds.  Pulmonary:     Effort: Pulmonary effort is normal.     Breath sounds: Normal breath sounds.  Musculoskeletal:     Cervical back: Normal range of motion and neck supple.     Comments: Normal Muscle Bulk and Muscle Testing Reveals:  Upper Extremities: Full ROM and Muscle Strength 5/5   Right AC Joint Tenderness  Lumbar Paraspinal Tenderness: L-3- L-5 Lower Extremities: Full ROM and Muscle Strength 5/5 Arises from chair with ease Narrow Based  Gait     Skin:    General: Skin is warm and dry.  Neurological:     Mental Status: He is alert and oriented to person, place, and time.  Psychiatric:        Mood and Affect: Mood normal.        Behavior: Behavior normal.         Assessment & Plan:  1.Cervicalgia/  Cervical Spondylosis with Radiculopathy: Continue HEP as Tolerated. Continue Lamictal. 04/25/2021. 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 Continue HEP as Tolerated .04/25/2021. 3. Injury of right shoulder and right  upper arm. Continue HEP as Tolerated. Continue to Monitor. 04/25/2021. 4. Chronic Pain Syndrome:Continue: Oxymorphone 10 mg one tablet every 6 hours as needed for pain # 120. 04/25/2021. We will continue the opioid monitoring program, this consists of regular clinic visits, examinations, urine drug screen, pill counts as well as use of [place] Controlled Substance Reporting system. A 12 month History has been reviewed on the [place] Controlled Substance Reporting System on 04/25/2021. 5. Left Knee Pain: No Complaints Today. Continue HEP as Tolerated. Continue to Monitor. 04/25/2021 6. Neuropathic Pain: Continue Gabapentin. He is scheduled for NCS with Dr Wynn Banker in 12/22 Continue to Monitor. 04/25/2021   F/U in 1 month.

## 2021-04-28 ENCOUNTER — Other Ambulatory Visit (HOSPITAL_COMMUNITY): Payer: Self-pay

## 2021-04-28 ENCOUNTER — Telehealth: Payer: Self-pay | Admitting: Registered Nurse

## 2021-04-28 DIAGNOSIS — S4491XS Injury of unspecified nerve at shoulder and upper arm level, right arm, sequela: Secondary | ICD-10-CM

## 2021-04-28 MED ORDER — OXYMORPHONE HCL 10 MG PO TABS
10.0000 mg | ORAL_TABLET | Freq: Four times a day (QID) | ORAL | 0 refills | Status: DC | PRN
  Filled 2021-04-28: qty 98, 25d supply, fill #0

## 2021-04-28 NOTE — Telephone Encounter (Signed)
PMP was Reviewed. Oxymorphone e-scribed to [facility]. Placed a call to Mr. Eplin, he verbalizes understanding of the above.

## 2021-04-28 NOTE — Telephone Encounter (Signed)
Please call patient regarding medication. He was unable to get his script.

## 2021-05-02 LAB — TOXASSURE SELECT,+ANTIDEPR,UR

## 2021-05-07 ENCOUNTER — Telehealth: Payer: Self-pay | Admitting: *Deleted

## 2021-05-07 NOTE — Telephone Encounter (Signed)
Urine drug screen for this encounter is consistent for prescribed medication 

## 2021-05-19 ENCOUNTER — Encounter: Payer: Medicare HMO | Attending: Physical Medicine & Rehabilitation | Admitting: Registered Nurse

## 2021-05-19 ENCOUNTER — Other Ambulatory Visit: Payer: Self-pay

## 2021-05-19 ENCOUNTER — Encounter: Payer: Self-pay | Admitting: Registered Nurse

## 2021-05-19 ENCOUNTER — Other Ambulatory Visit (HOSPITAL_COMMUNITY): Payer: Self-pay

## 2021-05-19 VITALS — BP 115/83 | HR 98 | Ht 70.0 in | Wt 196.0 lb

## 2021-05-19 DIAGNOSIS — M5412 Radiculopathy, cervical region: Secondary | ICD-10-CM | POA: Insufficient documentation

## 2021-05-19 DIAGNOSIS — G894 Chronic pain syndrome: Secondary | ICD-10-CM | POA: Insufficient documentation

## 2021-05-19 DIAGNOSIS — M4722 Other spondylosis with radiculopathy, cervical region: Secondary | ICD-10-CM | POA: Diagnosis not present

## 2021-05-19 DIAGNOSIS — M545 Low back pain, unspecified: Secondary | ICD-10-CM | POA: Diagnosis not present

## 2021-05-19 DIAGNOSIS — G8929 Other chronic pain: Secondary | ICD-10-CM | POA: Insufficient documentation

## 2021-05-19 DIAGNOSIS — Z79891 Long term (current) use of opiate analgesic: Secondary | ICD-10-CM | POA: Insufficient documentation

## 2021-05-19 DIAGNOSIS — M542 Cervicalgia: Secondary | ICD-10-CM | POA: Insufficient documentation

## 2021-05-19 DIAGNOSIS — S4491XS Injury of unspecified nerve at shoulder and upper arm level, right arm, sequela: Secondary | ICD-10-CM | POA: Insufficient documentation

## 2021-05-19 DIAGNOSIS — Z5181 Encounter for therapeutic drug level monitoring: Secondary | ICD-10-CM | POA: Insufficient documentation

## 2021-05-19 MED ORDER — OXYMORPHONE HCL 10 MG PO TABS
10.0000 mg | ORAL_TABLET | Freq: Four times a day (QID) | ORAL | 0 refills | Status: DC | PRN
  Filled 2021-05-19 – 2021-05-21 (×2): qty 120, 30d supply, fill #0

## 2021-05-19 NOTE — Progress Notes (Signed)
Subjective:    Patient ID: Erik Taylor, male    DOB: 1956-06-27, 65 y.o.   MRN: [id number]  HPI: Erik Taylor is a 65 y.o. male who is scheduled for a My-Chart Visit today, he agrees with My-Chart visit and verbalizes understanding.  His My- Chart was changed to a telephone visit, due to lost connection at the onset of visit. He states his pain is located in his  neck radiating into his right shoulder, lower back and left hand pain with numbness and tingling. He rates his pain 9. His current exercise regime is walking and performing stretching exercises.  Mr. Want Morphine equivalent is 117.60  MME.   Last UDS was Performed on 04/25/2021, it was consistent.    Pain Inventory Average Pain 8 Pain Right Now 9 My pain is constant, sharp, burning, dull, stabbing, tingling, and aching  In the last 24 hours, has pain interfered with the following? General activity 9 Relation with others 0 Enjoyment of life 8 What TIME of day is your pain at its worst? daytime and night Sleep (in general) Poor  Pain is worse with: walking, bending, standing, and some activites Pain improves with: rest, heat/ice, and medication Relief from Meds: 7  Family History  Problem Relation Age of Onset   Stroke Mother    Heart disease Father    Heart disease Sister    Kidney disease Brother    Social History   Socioeconomic History   Marital status: Married    Spouse name: Not on file   Number of children: Not on file   Years of education: Not on file   Highest education level: Not on file  Occupational History   Not on file  Tobacco Use   Smoking status: Former    Types: Cigarettes    Quit date: 02/01/1994    Years since quitting: 27.3   Smokeless tobacco: Never  Vaping Use   Vaping Use: Never used  Substance and Sexual Activity   Alcohol use: Never   Drug use: Never   Sexual activity: Not on file  Other Topics Concern   Not on file  Social History Narrative   Not on file   Social  Determinants of Health   Financial Resource Strain: Not on file  Food Insecurity: Not on file  Transportation Needs: Not on file  Physical Activity: Not on file  Stress: Not on file  Social Connections: Not on file   Past Surgical History:  Procedure Laterality Date   arm nere repair     nerve repair   TONSILLECTOMY     Past Surgical History:  Procedure Laterality Date   arm nere repair     nerve repair   TONSILLECTOMY     Past Medical History:  Diagnosis Date   Arthritis    Diabetes mellitus without complication (HCC)    Neuromuscular disorder (HCC)    BP 115/83   Pulse 98   Ht 5\' 10"  (1.778 m)   Wt 196 lb (88.9 kg)   SpO2 97%   BMI 28.12 kg/m   Opioid Risk Score:   Fall Risk Score:  `1  Depression screen PHQ 2/9  Depression screen [facility] 2/9 04/25/2021 03/26/2021 02/28/2021 01/31/2021 12/05/2020 11/07/2020 10/04/2020  Decreased Interest 1 0 1 1 1 1  0  Down, Depressed, Hopeless 1 0 1 1 1 1  0  PHQ - 2 Score 2 0 2 2 2 2  0  Altered sleeping - - - - - 1 -  Tired, decreased energy - - - - - - -  Change in appetite - - - - - - -  Feeling bad or failure about yourself  - - - - - - -  Trouble concentrating - - - - - - -  Moving slowly or fidgety/restless - - - - - - -  Suicidal thoughts - - - - - - -  PHQ-9 Score - - - - - 3 -  Difficult doing work/chores - - - - - - -    Review of Systems  Musculoskeletal:  Positive for back pain, gait problem and neck pain.       Shoulder pain, pain in hands  Neurological:  Positive for numbness.  All other systems reviewed and are negative.     Objective:   Physical Exam Vitals and nursing note reviewed.  Musculoskeletal:     Comments: No Physical Exam Performed: My Chart Video Visit         Assessment & Plan:  1.Cervicalgia/  Cervical Spondylosis with Radiculopathy: Continue HEP as Tolerated. Continue Lamictal. 05/19/2021. 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 Continue HEP as  Tolerated .05/19/2021. 3. Injury of right shoulder and right  upper arm. Continue HEP as Tolerated. Continue to Monitor. 05/19/2021. 4. Chronic Pain Syndrome:Continue: Oxymorphone 10 mg one tablet every 6 hours as needed for pain # 120. 05/19/2021. We will continue the opioid monitoring program, this consists of regular clinic visits, examinations, urine drug screen, pill counts as well as use of [place] Controlled Substance Reporting system. A 12 month History has been reviewed on the [place] Controlled Substance Reporting System on 05/19/2021. 5. Left Knee Pain: No Complaints Today. Continue HEP as Tolerated. Continue to Monitor. 05/19/2021 6. Neuropathic Pain: Continue Lamictal  He is scheduled for NCS with Dr Wynn Banker in 12/22 Continue to Monitor. 05/19/2021   F/U in 1 month.  My/Chart/ Telephone Visit Established Patient Location of Patient: In his Home Location of Provider: In the Office  Total Time Spent: 10 Minutes

## 2021-05-21 ENCOUNTER — Other Ambulatory Visit (HOSPITAL_COMMUNITY): Payer: Self-pay

## 2021-05-23 ENCOUNTER — Telehealth: Payer: Medicare HMO | Admitting: Registered Nurse

## 2021-06-04 ENCOUNTER — Ambulatory Visit: Payer: Medicare HMO | Admitting: Physical Medicine & Rehabilitation

## 2021-06-06 ENCOUNTER — Encounter: Payer: Medicare HMO | Attending: Physical Medicine & Rehabilitation | Admitting: Physical Medicine & Rehabilitation

## 2021-06-06 ENCOUNTER — Encounter: Payer: Self-pay | Admitting: Physical Medicine & Rehabilitation

## 2021-06-06 ENCOUNTER — Other Ambulatory Visit: Payer: Self-pay

## 2021-06-06 VITALS — BP 153/87 | HR 88 | Temp 97.9°F | Ht 70.0 in | Wt 194.0 lb

## 2021-06-06 DIAGNOSIS — G5602 Carpal tunnel syndrome, left upper limb: Secondary | ICD-10-CM | POA: Insufficient documentation

## 2021-06-06 NOTE — Progress Notes (Signed)
65 yo male with DM2 referred for L hand numbness, which started 6 mo ago and is worse at night.  Does not have hx of L hand or wrist injury.  + neck pain.  Patient referred for EMG/NCV to evaluate carpal tunnel versus cervical radiculopathy as underlying etiology for left upper extremity symptoms. 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  In addition there was absent radial and ulnar sensory responses on the left side but normal ulnar nerve motor responses.  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 There is no electrodiagnostic evidence of cervical radiculopathy.

## 2021-06-10 ENCOUNTER — Other Ambulatory Visit: Payer: Self-pay | Admitting: Physical Medicine & Rehabilitation

## 2021-06-10 DIAGNOSIS — G5602 Carpal tunnel syndrome, left upper limb: Secondary | ICD-10-CM

## 2021-06-10 DIAGNOSIS — M4722 Other spondylosis with radiculopathy, cervical region: Secondary | ICD-10-CM

## 2021-06-13 ENCOUNTER — Telehealth: Payer: Self-pay | Admitting: Registered Nurse

## 2021-06-13 NOTE — Telephone Encounter (Signed)
Patient called office needing to get a refill on oxymorphone.  Any questions please call patient.

## 2021-06-13 NOTE — Telephone Encounter (Signed)
PMP was reviewed.   Opana was filled on 05/21/2021, placed a call to Erik Taylor he is aware of the above . He will call office on Monday 06/16/2021, he verbalizes understanding.

## 2021-06-17 ENCOUNTER — Telehealth: Payer: Self-pay | Admitting: Registered Nurse

## 2021-06-17 ENCOUNTER — Other Ambulatory Visit (HOSPITAL_COMMUNITY): Payer: Self-pay

## 2021-06-17 DIAGNOSIS — S4491XS Injury of unspecified nerve at shoulder and upper arm level, right arm, sequela: Secondary | ICD-10-CM

## 2021-06-17 MED ORDER — OXYMORPHONE HCL 10 MG PO TABS
10.0000 mg | ORAL_TABLET | Freq: Four times a day (QID) | ORAL | 0 refills | Status: DC | PRN
  Filled 2021-06-17 – 2021-06-18 (×2): qty 120, 30d supply, fill #0

## 2021-06-17 NOTE — Telephone Encounter (Signed)
PMP was Reviewed.  Oxymorphone e-scribed.  Erik Taylor is aware via My-Chart message.

## 2021-06-18 ENCOUNTER — Other Ambulatory Visit (HOSPITAL_COMMUNITY): Payer: Self-pay

## 2021-06-25 ENCOUNTER — Other Ambulatory Visit (HOSPITAL_COMMUNITY): Payer: Self-pay

## 2021-07-11 ENCOUNTER — Other Ambulatory Visit (HOSPITAL_COMMUNITY): Payer: Self-pay

## 2021-07-11 ENCOUNTER — Encounter: Payer: Self-pay | Admitting: Registered Nurse

## 2021-07-11 ENCOUNTER — Encounter: Payer: Medicare HMO | Attending: Physical Medicine & Rehabilitation | Admitting: Registered Nurse

## 2021-07-11 ENCOUNTER — Ambulatory Visit: Payer: Medicare HMO | Admitting: Registered Nurse

## 2021-07-11 ENCOUNTER — Other Ambulatory Visit: Payer: Self-pay

## 2021-07-11 VITALS — BP 168/96 | HR 72 | Temp 98.9°F | Ht 70.0 in | Wt 205.0 lb

## 2021-07-11 DIAGNOSIS — G8929 Other chronic pain: Secondary | ICD-10-CM | POA: Insufficient documentation

## 2021-07-11 DIAGNOSIS — M792 Neuralgia and neuritis, unspecified: Secondary | ICD-10-CM | POA: Diagnosis present

## 2021-07-11 DIAGNOSIS — M545 Low back pain, unspecified: Secondary | ICD-10-CM | POA: Diagnosis present

## 2021-07-11 DIAGNOSIS — M4722 Other spondylosis with radiculopathy, cervical region: Secondary | ICD-10-CM

## 2021-07-11 DIAGNOSIS — M542 Cervicalgia: Secondary | ICD-10-CM

## 2021-07-11 DIAGNOSIS — Z5181 Encounter for therapeutic drug level monitoring: Secondary | ICD-10-CM

## 2021-07-11 DIAGNOSIS — Z79891 Long term (current) use of opiate analgesic: Secondary | ICD-10-CM

## 2021-07-11 DIAGNOSIS — M5412 Radiculopathy, cervical region: Secondary | ICD-10-CM

## 2021-07-11 DIAGNOSIS — G894 Chronic pain syndrome: Secondary | ICD-10-CM | POA: Diagnosis present

## 2021-07-11 DIAGNOSIS — S4491XS Injury of unspecified nerve at shoulder and upper arm level, right arm, sequela: Secondary | ICD-10-CM

## 2021-07-11 MED ORDER — OXYMORPHONE HCL 10 MG PO TABS
10.0000 mg | ORAL_TABLET | Freq: Four times a day (QID) | ORAL | 0 refills | Status: DC | PRN
  Filled 2021-07-11 – 2021-07-17 (×2): qty 120, 30d supply, fill #0

## 2021-07-11 NOTE — Progress Notes (Signed)
Subjective:    Patient ID: Erik Taylor, male    DOB: 12-20-1955, 66 y.o.   MRN: [id number]  HPI: Erik Taylor is a 66 y.o. male who returns for follow up appointment for chronic pain and medication refill. He states his pain is located in his neck radiating into his right shoulder, lower back pain radiating into his right lower extremity occasionally and left hand numbness. He rates his pain 8. His current exercise regime is walking and performing stretching exercises.  Erik Taylor Morphine equivalent is 120.00 MME.   UDS ordered today.     Pain Inventory Average Pain 7 Pain Right Now 8 My pain is sharp, burning, dull, stabbing, tingling, and aching  In the last 24 hours, has pain interfered with the following? General activity 7 Relation with others 5 Enjoyment of life 6 What TIME of day is your pain at its worst? varies Sleep (in general) Poor  Pain is worse with: walking, bending, sitting, inactivity, standing, and some activites Pain improves with: rest, heat/ice, and medication Relief from Meds: 7  Family History  Problem Relation Age of Onset   Stroke Mother    Heart disease Father    Heart disease Sister    Kidney disease Brother    Social History   Socioeconomic History   Marital status: Married    Spouse name: Not on file   Number of children: Not on file   Years of education: Not on file   Highest education level: Not on file  Occupational History   Not on file  Tobacco Use   Smoking status: Former    Types: Cigarettes    Quit date: 02/01/1994    Years since quitting: 27.4   Smokeless tobacco: Never  Vaping Use   Vaping Use: Never used  Substance and Sexual Activity   Alcohol use: Never   Drug use: Never   Sexual activity: Not on file  Other Topics Concern   Not on file  Social History Narrative   Not on file   Social Determinants of Health   Financial Resource Strain: Not on file  Food Insecurity: Not on file  Transportation Needs:  Not on file  Physical Activity: Not on file  Stress: Not on file  Social Connections: Not on file   Past Surgical History:  Procedure Laterality Date   arm nere repair     nerve repair   TONSILLECTOMY     Past Surgical History:  Procedure Laterality Date   arm nere repair     nerve repair   TONSILLECTOMY     Past Medical History:  Diagnosis Date   Arthritis    Diabetes mellitus without complication (HCC)    Neuromuscular disorder (HCC)    BP (!) 170/103    Pulse 75    Temp 98.9 F (37.2 C) (Oral)    Ht 5\' 10"  (1.778 m)    Wt 205 lb (93 kg)    SpO2 99%    BMI 29.41 kg/m   Opioid Risk Score:   Fall Risk Score:  `1  Depression screen PHQ 2/9  Depression screen [facility] 2/9 05/19/2021 04/25/2021 03/26/2021 02/28/2021 01/31/2021 12/05/2020 11/07/2020  Decreased Interest 0 1 0 1 1 1 1   Down, Depressed, Hopeless 0 1 0 1 1 1 1   PHQ - 2 Score 0 2 0 2 2 2 2   Altered sleeping - - - - - - 1  Tired, decreased energy - - - - - - -  Change in appetite - - - - - - -  Feeling bad or failure about yourself  - - - - - - -  Trouble concentrating - - - - - - -  Moving slowly or fidgety/restless - - - - - - -  Suicidal thoughts - - - - - - -  PHQ-9 Score - - - - - - 3  Difficult doing work/chores - - - - - - -     Review of Systems  Musculoskeletal:  Positive for back pain.       Right shoulder pain Wrist pain Right arm pain  All other systems reviewed and are negative.     Objective:   Physical Exam Vitals and nursing note reviewed.  Constitutional:      Appearance: Normal appearance.  Neck:     Comments: Cervical Paraspinal Tenderness: C-5-C-6 Cardiovascular:     Rate and Rhythm: Normal rate and regular rhythm.     Pulses: Normal pulses.     Heart sounds: Normal heart sounds.  Pulmonary:     Effort: Pulmonary effort is normal.     Breath sounds: Normal breath sounds.  Musculoskeletal:     Cervical back: Normal range of motion and neck supple.     Comments: Normal Muscle Bulk  and Muscle Testing Reveals:  Upper Extremities: Full ROM and Muscle Strength on Right 4/5 and Left 5/5 Right AC Joint Tenderness Lumbar Paraspinal Tenderness: L-3-L-5 Lower Extremities: Full ROM and Muscle Strength 5/5  Arises from Chair with Ease  Narrow Based Gait     Skin:    General: Skin is warm and dry.  Neurological:     Mental Status: He is alert and oriented to person, place, and time.  Psychiatric:        Mood and Affect: Mood normal.        Behavior: Behavior normal.         Assessment & Plan:  1.Cervicalgia/  Cervical Spondylosis with Radiculopathy: Continue HEP as Tolerated. Continue Lamictal. 07/11/2021. 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 Continue HEP as Tolerated .07/11/2021. 3. Injury of right shoulder and right  upper arm. Continue HEP as Tolerated. Continue to Monitor. 07/11/2021. 4. Chronic Pain Syndrome:Continue: Oxymorphone 10 mg one tablet every 6 hours as needed for pain # 120. 07/11/2021. We will continue the opioid monitoring program, this consists of regular clinic visits, examinations, urine drug screen, pill counts as well as use of [place] Controlled Substance Reporting system. A 12 month History has been reviewed on the [place] Controlled Substance Reporting System on 07/11/2021. 5. Left Knee Pain: No Complaints Today. Continue HEP as Tolerated. Continue to Monitor. 07/11/2021 6. Neuropathic Pain: Continue Lamictal  He had NCS with Dr Wynn Banker in 12/22 Continue to Monitor. 07/11/2021   F/U in 1 month.

## 2021-07-17 ENCOUNTER — Other Ambulatory Visit (HOSPITAL_COMMUNITY): Payer: Self-pay

## 2021-07-17 ENCOUNTER — Other Ambulatory Visit: Payer: Self-pay | Admitting: Registered Nurse

## 2021-07-17 ENCOUNTER — Telehealth: Payer: Self-pay | Admitting: Registered Nurse

## 2021-07-17 ENCOUNTER — Telehealth: Payer: Self-pay

## 2021-07-17 DIAGNOSIS — S4491XS Injury of unspecified nerve at shoulder and upper arm level, right arm, sequela: Secondary | ICD-10-CM

## 2021-07-17 LAB — TOXASSURE SELECT,+ANTIDEPR,UR

## 2021-07-17 MED ORDER — OXYCODONE HCL 15 MG PO TABS: 15.0000 mg | ORAL_TABLET | Freq: Four times a day (QID) | ORAL | 0 refills | Status: DC | PRN

## 2021-07-17 MED ORDER — OXYMORPHONE HCL 10 MG PO TABS: 10.0000 mg | ORAL_TABLET | Freq: Four times a day (QID) | ORAL | 0 refills | Status: DC | PRN

## 2021-07-17 NOTE — Telephone Encounter (Signed)
Patient called stating he was talking to [facility] about his medication. He states that [facility] in [place] has Oxymorphone ER and to send prescription there.

## 2021-07-17 NOTE — Telephone Encounter (Signed)
Patient needs script called into [facility] can order he is down to the last few pills needs advice needed.

## 2021-07-17 NOTE — Telephone Encounter (Signed)
Return Mr. Hettinger call,  [facility] was called Opana prescription was removed from Profile.  Opana prescription was sent to [facility] in [place] , [place]. Erik Taylor is aware.  He will count how many Opana tablets he has  left and this provider will give him call. He verbalizes understanding.

## 2021-07-17 NOTE — Telephone Encounter (Signed)
Opana is on Backorder. This provider spoke with [facility], Opana prescription removed from profile. Oxycodone 15 mg one tablet every 6 hours as needed for pain e-scribed today.  PMP was Reviewed.  Erik Taylor is aware to complete his Opana, prior to starting Oxycodone 15 mg tables, he verbalizes understanding.   This Provider spoke with Erik Taylor regarding the above, he verbalizes understanding.

## 2021-07-17 NOTE — Addendum Note (Signed)
Addended by: Jones Bales on: 07/17/2021 10:49 AM   Modules accepted: Orders

## 2021-07-17 NOTE — Telephone Encounter (Signed)
This came from the pharmacy ?

## 2021-07-17 NOTE — Telephone Encounter (Signed)
Oxycodone e-scribed with Diagnosis Codes.  [facility] called and spoke with pharmacist .Erik Taylor called regarding the above, he verbalizes understanding.

## 2021-07-18 ENCOUNTER — Telehealth: Payer: Self-pay | Admitting: *Deleted

## 2021-07-18 NOTE — Telephone Encounter (Signed)
Urine drug screen for this encounter is consistent for prescribed medication 

## 2021-08-11 ENCOUNTER — Encounter: Payer: Self-pay | Admitting: Registered Nurse

## 2021-08-11 ENCOUNTER — Other Ambulatory Visit (HOSPITAL_COMMUNITY): Payer: Self-pay

## 2021-08-11 ENCOUNTER — Encounter: Payer: Self-pay | Admitting: Physical Medicine & Rehabilitation

## 2021-08-11 ENCOUNTER — Encounter: Payer: Medicare HMO | Attending: Physical Medicine & Rehabilitation | Admitting: Registered Nurse

## 2021-08-11 ENCOUNTER — Other Ambulatory Visit: Payer: Self-pay

## 2021-08-11 VITALS — BP 134/88 | HR 96 | Ht 70.0 in | Wt 191.0 lb

## 2021-08-11 DIAGNOSIS — M545 Low back pain, unspecified: Secondary | ICD-10-CM | POA: Insufficient documentation

## 2021-08-11 DIAGNOSIS — G894 Chronic pain syndrome: Secondary | ICD-10-CM | POA: Diagnosis present

## 2021-08-11 DIAGNOSIS — S4491XS Injury of unspecified nerve at shoulder and upper arm level, right arm, sequela: Secondary | ICD-10-CM | POA: Diagnosis present

## 2021-08-11 DIAGNOSIS — M5412 Radiculopathy, cervical region: Secondary | ICD-10-CM | POA: Diagnosis present

## 2021-08-11 DIAGNOSIS — G8929 Other chronic pain: Secondary | ICD-10-CM | POA: Diagnosis present

## 2021-08-11 DIAGNOSIS — G5602 Carpal tunnel syndrome, left upper limb: Secondary | ICD-10-CM | POA: Diagnosis present

## 2021-08-11 DIAGNOSIS — M542 Cervicalgia: Secondary | ICD-10-CM | POA: Diagnosis present

## 2021-08-11 DIAGNOSIS — M792 Neuralgia and neuritis, unspecified: Secondary | ICD-10-CM | POA: Diagnosis present

## 2021-08-11 DIAGNOSIS — Z79891 Long term (current) use of opiate analgesic: Secondary | ICD-10-CM

## 2021-08-11 DIAGNOSIS — Z5181 Encounter for therapeutic drug level monitoring: Secondary | ICD-10-CM | POA: Diagnosis present

## 2021-08-11 MED ORDER — OXYMORPHONE HCL 10 MG PO TABS
10.0000 mg | ORAL_TABLET | Freq: Four times a day (QID) | ORAL | 0 refills | Status: DC | PRN
  Filled 2021-08-11 – 2021-08-14 (×2): qty 120, 30d supply, fill #0

## 2021-08-11 NOTE — Progress Notes (Signed)
Subjective:    Patient ID: Erik Taylor, male    DOB: 02-Jun-1956, 66 y.o.   MRN: [id number]  HPI: Erik Taylor is a 66 y.o. male who returns for follow up appointment for chronic pain and medication refill. He states his pain is located in his neck radiating into his right shoulder and lower back pain. Also report bilateral hand pain tingling and burning.  She rates her pain 7. Her current exercise regime is walking and performing stretching exercises.  Erik Taylor Morphine equivalent is 90.00 MME.   Last UDS was Performed on 07/11/2021, it was consistent.    Pain Inventory Average Pain 7 Pain Right Now 7 My pain is sharp, burning, dull, stabbing, tingling, and aching  In the last 24 hours, has pain interfered with the following? General activity 7 Relation with others 6 Enjoyment of life 4 What TIME of day is your pain at its worst? varies Sleep (in general) Fair  Pain is worse with: walking, bending, sitting, inactivity, standing, unsure, and some activites Pain improves with: rest and medication Relief from Meds: 5  Family History  Problem Relation Age of Onset   Stroke Mother    Heart disease Father    Heart disease Sister    Kidney disease Brother    Social History   Socioeconomic History   Marital status: Married    Spouse name: Not on file   Number of children: Not on file   Years of education: Not on file   Highest education level: Not on file  Occupational History   Not on file  Tobacco Use   Smoking status: Former    Types: Cigarettes    Quit date: 02/01/1994    Years since quitting: 27.5   Smokeless tobacco: Never  Vaping Use   Vaping Use: Never used  Substance and Sexual Activity   Alcohol use: Never   Drug use: Never   Sexual activity: Not on file  Other Topics Concern   Not on file  Social History Narrative   Not on file   Social Determinants of Health   Financial Resource Strain: Not on file  Food Insecurity: Not on file   Transportation Needs: Not on file  Physical Activity: Not on file  Stress: Not on file  Social Connections: Not on file   Past Surgical History:  Procedure Laterality Date   arm nere repair     nerve repair   TONSILLECTOMY     Past Surgical History:  Procedure Laterality Date   arm nere repair     nerve repair   TONSILLECTOMY     Past Medical History:  Diagnosis Date   Arthritis    Diabetes mellitus without complication (HCC)    Neuromuscular disorder (HCC)    BP 134/88    Pulse 96    Ht 5\' 10"  (1.778 m)    Wt 191 lb (86.6 kg)    SpO2 97%    BMI 27.41 kg/m   Opioid Risk Score:   Fall Risk Score:  `1  Depression screen PHQ 2/9  Depression screen [facility] 2/9 08/11/2021 05/19/2021 04/25/2021 03/26/2021 02/28/2021 01/31/2021 12/05/2020  Decreased Interest 1 0 1 0 1 1 1   Down, Depressed, Hopeless 1 0 1 0 1 1 1   PHQ - 2 Score 2 0 2 0 2 2 2   Altered sleeping - - - - - - -  Tired, decreased energy - - - - - - -  Change in appetite - - - - - - -  Feeling bad or failure about yourself  - - - - - - -  Trouble concentrating - - - - - - -  Moving slowly or fidgety/restless - - - - - - -  Suicidal thoughts - - - - - - -  PHQ-9 Score - - - - - - -  Difficult doing work/chores - - - - - - -      Review of Systems  Musculoskeletal:  Positive for back pain.       Right arm pain Bilateral thumb pain  All other systems reviewed and are negative.     Objective:   Physical Exam Vitals and nursing note reviewed.  Constitutional:      Appearance: Normal appearance.  Cardiovascular:     Rate and Rhythm: Normal rate and regular rhythm.     Pulses: Normal pulses.     Heart sounds: Normal heart sounds.  Musculoskeletal:     Cervical back: Normal range of motion and neck supple.     Comments: Normal Muscle Bulk and Muscle Testing Reveals: Upper Extremities: Right: Decreased ROM  90 Degrees and Muscle Strength on Right 4/5  Right AC Joint Tenderness Left Upper Extremity: Full ROM and  Muscle Strength 5/5 Lumbar Paraspinal Tenderness: L-4-L-5 Lower Extremities : Full ROM and Muscle Strength 5/5 Arises from  Chair slowly Narrow Based Gait     Skin:    General: Skin is warm and dry.  Neurological:     Mental Status: He is alert.         Assessment & Plan:  1.Cervicalgia/  Cervical Spondylosis with Radiculopathy: Continue HEP as Tolerated. Continue Lamictal. 08/11/2021. 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 Continue HEP as Tolerated .08/11/2021. 3. Injury of right shoulder and right  upper arm. Continue HEP as Tolerated. Continue to Monitor. 08/11/2021. 4. Chronic Pain Syndrome:Continue: Oxymorphone 10 mg one tablet every 6 hours as needed for pain # 120. 08/11/2021. We will continue the opioid monitoring program, this consists of regular clinic visits, examinations, urine drug screen, pill counts as well as use of [place] Controlled Substance Reporting system. A 12 month History has been reviewed on the [place] Controlled Substance Reporting System on 07/11/2021. 5. Left Knee Pain: No Complaints Today. Continue HEP as Tolerated. Continue to Monitor. 08/11/2021 6. Neuropathic Pain: Continue Lamictal  He had NCS with Dr Wynn Banker in 12/22 Continue to Monitor. 08/11/2021   F/U in 1 month.

## 2021-08-13 ENCOUNTER — Encounter: Payer: Self-pay | Admitting: Physical Medicine & Rehabilitation

## 2021-08-13 ENCOUNTER — Other Ambulatory Visit: Payer: Self-pay

## 2021-08-13 ENCOUNTER — Encounter (HOSPITAL_BASED_OUTPATIENT_CLINIC_OR_DEPARTMENT_OTHER): Payer: Medicare HMO | Admitting: Physical Medicine & Rehabilitation

## 2021-08-13 DIAGNOSIS — M542 Cervicalgia: Secondary | ICD-10-CM | POA: Diagnosis not present

## 2021-08-13 DIAGNOSIS — G5602 Carpal tunnel syndrome, left upper limb: Secondary | ICD-10-CM

## 2021-08-13 NOTE — Patient Instructions (Signed)
WEAR YOUR LEFT WRIST SPLINT AS MUCH AS POSSIBLE

## 2021-08-13 NOTE — Progress Notes (Signed)
PROCEDURE NOTE  DIAGNOSIS: LEFT CTS  INTERVENTION:  Left carpal tunnel injection     After informed consent and preparation of the skin with betadine and isopropyl alcohol, I injected 6mg  (1cc) of celestone and 2cc of 1% lidocaine into the left carpal tunnel using the palmaris longus tendon as a guide. Injection was medial to the tendon underneath the flexor retinaculum. Additionally, aspiration was performed prior to injection. The patient tolerated well, and no complications were encountered. Afterward the area was cleaned and dressed. Post- injection instructions were provided.      , MD, [facility] 08/13/2021

## 2021-08-14 ENCOUNTER — Other Ambulatory Visit (HOSPITAL_COMMUNITY): Payer: Self-pay

## 2021-08-15 ENCOUNTER — Ambulatory Visit: Payer: Medicare HMO | Admitting: Registered Nurse

## 2021-08-19 ENCOUNTER — Other Ambulatory Visit: Payer: Self-pay | Admitting: Physical Medicine & Rehabilitation

## 2021-08-19 DIAGNOSIS — M4722 Other spondylosis with radiculopathy, cervical region: Secondary | ICD-10-CM

## 2021-08-19 DIAGNOSIS — G5602 Carpal tunnel syndrome, left upper limb: Secondary | ICD-10-CM

## 2021-08-21 ENCOUNTER — Other Ambulatory Visit (HOSPITAL_COMMUNITY): Payer: Self-pay

## 2021-08-21 ENCOUNTER — Telehealth: Payer: Self-pay | Admitting: *Deleted

## 2021-08-21 NOTE — Telephone Encounter (Signed)
PA Case: [id number], Status: Approved, Coverage Starts on: 07/06/2021 12:00:00 AM, Coverage Ends on: 07/05/2022 12:00:00 AM. Questions? Contact [phone]. Pharmacy and patient notified.

## 2021-08-21 NOTE — Telephone Encounter (Signed)
Prior auth submitted to [facility] via CoverMyMeds for oxymorphone 10 mg.

## 2021-09-08 ENCOUNTER — Other Ambulatory Visit (HOSPITAL_COMMUNITY): Payer: Self-pay

## 2021-09-10 ENCOUNTER — Other Ambulatory Visit: Payer: Self-pay

## 2021-09-10 ENCOUNTER — Encounter: Payer: Medicare HMO | Attending: Physical Medicine & Rehabilitation | Admitting: Registered Nurse

## 2021-09-10 ENCOUNTER — Encounter: Payer: Self-pay | Admitting: Registered Nurse

## 2021-09-10 VITALS — BP 144/91 | HR 73 | Ht 70.0 in | Wt 186.0 lb

## 2021-09-10 DIAGNOSIS — M545 Low back pain, unspecified: Secondary | ICD-10-CM

## 2021-09-10 DIAGNOSIS — M5416 Radiculopathy, lumbar region: Secondary | ICD-10-CM | POA: Insufficient documentation

## 2021-09-10 DIAGNOSIS — G8929 Other chronic pain: Secondary | ICD-10-CM | POA: Insufficient documentation

## 2021-09-10 DIAGNOSIS — M542 Cervicalgia: Secondary | ICD-10-CM | POA: Diagnosis not present

## 2021-09-10 DIAGNOSIS — Z5181 Encounter for therapeutic drug level monitoring: Secondary | ICD-10-CM | POA: Diagnosis present

## 2021-09-10 DIAGNOSIS — S4491XS Injury of unspecified nerve at shoulder and upper arm level, right arm, sequela: Secondary | ICD-10-CM | POA: Diagnosis not present

## 2021-09-10 DIAGNOSIS — Z79891 Long term (current) use of opiate analgesic: Secondary | ICD-10-CM | POA: Insufficient documentation

## 2021-09-10 DIAGNOSIS — G894 Chronic pain syndrome: Secondary | ICD-10-CM | POA: Insufficient documentation

## 2021-09-10 DIAGNOSIS — M5412 Radiculopathy, cervical region: Secondary | ICD-10-CM | POA: Insufficient documentation

## 2021-09-10 DIAGNOSIS — M792 Neuralgia and neuritis, unspecified: Secondary | ICD-10-CM | POA: Insufficient documentation

## 2021-09-10 MED ORDER — OXYMORPHONE HCL ER 10 MG PO TB12: 10.0000 mg | ORAL_TABLET | Freq: Two times a day (BID) | ORAL | 0 refills | Status: DC

## 2021-09-10 MED ORDER — OXYCODONE HCL 10 MG PO TABS: 10.0000 mg | ORAL_TABLET | Freq: Three times a day (TID) | ORAL | 0 refills | Status: DC | PRN

## 2021-09-10 NOTE — Progress Notes (Signed)
? ?Subjective:  ? ? Patient ID: Erik Taylor, male    DOB: 08-Nov-1955, 66 y.o.   MRN: [id number] ? ?HPI: Erik Taylor is a 66 y.o. male who returns for follow up appointment for chronic pain and medication refill. He states his pain is located in his neck radiating into his right shoulder, right arm and right hand with numbness and tingling. He also reports lower back pain. He rates his pain 8. His current exercise regime is walking and performing stretching exercises. ? ?Erik Taylor Morphine equivalent is 120.00 MME.   Last UDS was Performed on 07/11/2021, it was consistent.  ?  ? ?Pain Inventory ?Average Pain 8 ?Pain Right Now 8 ?My pain is sharp, burning, dull, stabbing, tingling, and aching ? ?In the last 24 hours, has pain interfered with the following? ?General activity 6 ?Relation with others 5 ?Enjoyment of life 6 ?What TIME of day is your pain at its worst? daytime ?Sleep (in general) Poor ? ?Pain is worse with: walking, bending, sitting, inactivity, standing, unsure, and some activites ?Pain improves with: rest, heat/ice, medication, and injections ?Relief from Meds: 7 ? ?Family History  ?Problem Relation Age of Onset  ? Stroke Mother   ? Heart disease Father   ? Heart disease Sister   ? Kidney disease Brother   ? ?Social History  ? ?Socioeconomic History  ? Marital status: Married  ?  Spouse name: Not on file  ? Number of children: Not on file  ? Years of education: Not on file  ? Highest education level: Not on file  ?Occupational History  ? Not on file  ?Tobacco Use  ? Smoking status: Former  ?  Types: Cigarettes  ?  Quit date: 02/01/1994  ?  Years since quitting: 27.6  ? Smokeless tobacco: Never  ?Vaping Use  ? Vaping Use: Never used  ?Substance and Sexual Activity  ? Alcohol use: Never  ? Drug use: Never  ? Sexual activity: Not on file  ?Other Topics Concern  ? Not on file  ?Social History Narrative  ? Not on file  ? ?Social Determinants of Health  ? ?Financial Resource Strain: Not on file  ?Food  Insecurity: Not on file  ?Transportation Needs: Not on file  ?Physical Activity: Not on file  ?Stress: Not on file  ?Social Connections: Not on file  ? ?Past Surgical History:  ?Procedure Laterality Date  ? arm nere repair    ? nerve repair  ? TONSILLECTOMY    ? ?Past Surgical History:  ?Procedure Laterality Date  ? arm nere repair    ? nerve repair  ? TONSILLECTOMY    ? ?Past Medical History:  ?Diagnosis Date  ? Arthritis   ? Diabetes mellitus without complication (HCC)   ? Neuromuscular disorder (HCC)   ? ?BP (!) 160/94   Pulse 74   Ht 5\' 10"  (1.778 m)   Wt 186 lb (84.4 kg)   SpO2 98%   BMI 26.69 kg/m?  ? ?Opioid Risk Score:   ?Fall Risk Score:  `1 ? ?Depression screen PHQ 2/9 ? ?Depression screen [facility] 2/9 09/10/2021 08/13/2021 08/11/2021 05/19/2021 04/25/2021 03/26/2021 02/28/2021  ?Decreased Interest 0 1 1 0 1 0 1  ?Down, Depressed, Hopeless 0 1 1 0 1 0 1  ?PHQ - 2 Score 0 2 2 0 2 0 2  ?Altered sleeping - - - - - - -  ?Tired, decreased energy - - - - - - -  ?Change in appetite - - - - - - -  ?  Feeling bad or failure about yourself  - - - - - - -  ?Trouble concentrating - - - - - - -  ?Moving slowly or fidgety/restless - - - - - - -  ?Suicidal thoughts - - - - - - -  ?PHQ-9 Score - - - - - - -  ?Difficult doing work/chores - - - - - - -  ?  ? ?Review of Systems  ?Constitutional: Negative.   ?HENT: Negative.    ?Eyes: Negative.   ?Respiratory: Negative.    ?Cardiovascular: Negative.   ?Gastrointestinal: Negative.   ?Endocrine: Negative.   ?Genitourinary: Negative.   ?Musculoskeletal: Negative.   ?Skin: Negative.   ?Allergic/Immunologic: Negative.   ?Neurological:  Positive for numbness.  ?Hematological: Negative.   ?Psychiatric/Behavioral:  Positive for sleep disturbance.   ? ?   ?Objective:  ? Physical Exam ?Vitals and nursing note reviewed.  ?Constitutional:   ?   Appearance: Normal appearance.  ?Neck:  ?   Comments: Cervical Paraspinal Tenderness: C-5-C-6 ?Cardiovascular:  ?   Rate and Rhythm: Normal rate and  regular rhythm.  ?   Pulses: Normal pulses.  ?   Heart sounds: Normal heart sounds.  ?Pulmonary:  ?   Effort: Pulmonary effort is normal.  ?   Breath sounds: Normal breath sounds.  ?Musculoskeletal:  ?   Cervical back: Normal range of motion and neck supple.  ?   Comments: Normal Muscle Bulk and Muscle Testing Reveals:  ?Upper Extremities: Right: Decreased ROM  45 Degrees and Muscle Strength 5/5 ?Right AC Joint Tenderness ?Left Upper Extremity: Full ROM and Muscle Strength 5/5 ?Lumbar Paraspinal Tenderness: L-3-L-5 ?Lower Extremities: Full ROM and Muscle Strength 5/5 ?Arises from chair with ease ?Narrow Based  Gait  ?   ?Skin: ?   General: Skin is warm and dry.  ?Neurological:  ?   Mental Status: He is alert and oriented to person, place, and time.  ?Psychiatric:     ?   Mood and Affect: Mood normal.     ?   Behavior: Behavior normal.  ? ? ? ? ?   ?Assessment & Plan:  ?1.Cervicalgia/  Cervical Spondylosis with Radiculopathy: Continue HEP as Tolerated. Continue Lamictal. 09/10/2021. 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 Continue HEP as Tolerated .09/10/2021. ?3. Injury of right shoulder and right  upper arm. Continue HEP as Tolerated. Continue to Monitor. 09/10/2021. ?4. Chronic Pain Syndrome:Continue: RX: Oxymorphone 10 mg ER one tablet every 12 hours and Oxycodone 10 mg one tablet three times a day as needed for pain #90 . Discontinue  Oxymorphone 10 mg one tablet every 6 hours as needed for pain # 120 due lack of supply at several pharmacies. In the past he was prescribed Morphine and had reaction with nausea,and vomiting. Thje above discussed with Dr Riley Kill and he agrees with plan. 09/10/2021. ?We will continue the opioid monitoring program, this consists of regular clinic visits, examinations, urine drug screen, pill counts as well as use of [place] Controlled Substance Reporting system. A 12 month History has been reviewed on the [place] Controlled  Substance Reporting System on 09/10/2021. ?5. Left Knee Pain: No Complaints Today. Continue HEP as Tolerated. Continue to Monitor. 09/10/2021 ?6. Neuropathic Pain: Continue Lamictal  He had NCS with Dr Wynn Banker in 12/22 Continue to Monitor. 09/10/2021 ?  ?F/U in 1 month. ?  ? ? ? ? ? ? ? ? ?

## 2021-09-12 ENCOUNTER — Ambulatory Visit: Payer: Medicare HMO | Admitting: Registered Nurse

## 2021-09-25 ENCOUNTER — Other Ambulatory Visit: Payer: Self-pay | Admitting: Physical Medicine & Rehabilitation

## 2021-09-25 DIAGNOSIS — M4722 Other spondylosis with radiculopathy, cervical region: Secondary | ICD-10-CM

## 2021-09-25 DIAGNOSIS — G5602 Carpal tunnel syndrome, left upper limb: Secondary | ICD-10-CM

## 2021-09-29 ENCOUNTER — Encounter: Payer: Self-pay | Admitting: Registered Nurse

## 2021-09-29 ENCOUNTER — Encounter: Payer: Medicare HMO | Admitting: Registered Nurse

## 2021-09-29 ENCOUNTER — Other Ambulatory Visit: Payer: Self-pay

## 2021-09-29 VITALS — BP 157/88 | HR 72 | Temp 98.6°F | Ht 70.0 in | Wt 187.3 lb

## 2021-09-29 DIAGNOSIS — M542 Cervicalgia: Secondary | ICD-10-CM | POA: Diagnosis not present

## 2021-09-29 DIAGNOSIS — M5416 Radiculopathy, lumbar region: Secondary | ICD-10-CM

## 2021-09-29 DIAGNOSIS — Z5181 Encounter for therapeutic drug level monitoring: Secondary | ICD-10-CM

## 2021-09-29 DIAGNOSIS — Z79891 Long term (current) use of opiate analgesic: Secondary | ICD-10-CM

## 2021-09-29 DIAGNOSIS — G894 Chronic pain syndrome: Secondary | ICD-10-CM | POA: Diagnosis not present

## 2021-09-29 DIAGNOSIS — M5412 Radiculopathy, cervical region: Secondary | ICD-10-CM | POA: Diagnosis not present

## 2021-09-29 DIAGNOSIS — M792 Neuralgia and neuritis, unspecified: Secondary | ICD-10-CM | POA: Diagnosis not present

## 2021-09-29 MED ORDER — OXYCODONE HCL 15 MG PO TABS: 15.0000 mg | ORAL_TABLET | Freq: Four times a day (QID) | ORAL | 0 refills | Status: DC | PRN

## 2021-09-29 NOTE — Progress Notes (Signed)
? ?Subjective:  ? ? Patient ID: Erik Taylor, male    DOB: 1956/05/17, 66 y.o.   MRN: [id number] ? ?HPI: Erik Taylor is a 66 y.o. male who returns for follow up appointment for chronic pain and medication refill. He states his pain is located in his neck radiating into his right shoulder, right arm with tingling, lower back pain radiating into his right buttock and left hand with tingling. He rates his pain 9. His current exercise regime is walking and performing stretching exercises. ? ?Erik Taylor Morphine equivalent is 118.50 MME.   Erik Taylor oxymorphone ER was discontinued due to medication shortage , he verbalizes understanding. Erik Taylor Oxycodone was increased to Oxycodone 15 mg  4 times a day as needed for pain.  ? ?Last UDS was Performed on 07/11/2021, it was consistent.  ?  ? ?Pain Inventory ?Average Pain 8 ?Pain Right Now 9 ?My pain is sharp, burning, dull, stabbing, tingling, and aching ? ?In the last 24 hours, has pain interfered with the following? ?General activity 7 ?Relation with others 5 ?Enjoyment of life 6 ?What TIME of day is your pain at its worst? morning , daytime, evening, and night ?Sleep (in general) Poor ? ?Pain is worse with: walking, bending, standing, and some activites ?Pain improves with: rest, heat/ice, and medication ?Relief from Meds: 6 ? ?Family History  ?Problem Relation Age of Onset  ? Stroke Mother   ? Heart disease Father   ? Heart disease Sister   ? Kidney disease Brother   ? ?Social History  ? ?Socioeconomic History  ? Marital status: Married  ?  Spouse name: Not on file  ? Number of children: Not on file  ? Years of education: Not on file  ? Highest education level: Not on file  ?Occupational History  ? Not on file  ?Tobacco Use  ? Smoking status: Former  ?  Types: Cigarettes  ?  Quit date: 02/01/1994  ?  Years since quitting: 27.6  ? Smokeless tobacco: Never  ?Vaping Use  ? Vaping Use: Never used  ?Substance and Sexual Activity  ? Alcohol use: Never  ? Drug use:  Never  ? Sexual activity: Not on file  ?Other Topics Concern  ? Not on file  ?Social History Narrative  ? Not on file  ? ?Social Determinants of Health  ? ?Financial Resource Strain: Not on file  ?Food Insecurity: Not on file  ?Transportation Needs: Not on file  ?Physical Activity: Not on file  ?Stress: Not on file  ?Social Connections: Not on file  ? ?Past Surgical History:  ?Procedure Laterality Date  ? arm nere repair    ? nerve repair  ? TONSILLECTOMY    ? ?Past Surgical History:  ?Procedure Laterality Date  ? arm nere repair    ? nerve repair  ? TONSILLECTOMY    ? ?Past Medical History:  ?Diagnosis Date  ? Arthritis   ? Diabetes mellitus without complication (HCC)   ? Neuromuscular disorder (HCC)   ? ?BP (!) 157/88   Pulse 72   Temp 98.6 ?F (37 ?C)   Ht 5\' 10"  (1.778 m)   Wt 187 lb 4.8 oz (85 kg)   SpO2 99%   BMI 26.87 kg/m?  ? ?Opioid Risk Score:   ?Fall Risk Score:  `1 ? ?Depression screen PHQ 2/9 ? ? ?  09/29/2021  ?  8:07 AM 09/10/2021  ?  8:48 AM 08/13/2021  ? 10:09 AM 08/11/2021  ?  8:53  AM 05/19/2021  ? 10:44 AM 04/25/2021  ?  9:20 AM 03/26/2021  ? 10:11 AM  ?Depression screen PHQ 2/9  ?Decreased Interest 1 0 1 1 0 1 0  ?Down, Depressed, Hopeless 1 0 1 1 0 1 0  ?PHQ - 2 Score 2 0 2 2 0 2 0  ?  ? ?Review of Systems  ?Constitutional: Negative.   ?HENT: Negative.    ?Eyes: Negative.   ?Respiratory: Negative.    ?Cardiovascular: Negative.   ?Gastrointestinal: Negative.   ?Endocrine: Negative.   ?Genitourinary: Negative.   ?Musculoskeletal:  Positive for back pain and neck pain.  ?Skin: Negative.   ?Allergic/Immunologic: Negative.   ?Neurological:   ?     Tingling  ?Hematological: Negative.   ?Psychiatric/Behavioral:  Positive for dysphoric mood.   ?All other systems reviewed and are negative. ? ?   ?Objective:  ? Physical Exam ?Vitals and nursing note reviewed.  ?Constitutional:   ?   Appearance: Normal appearance.  ?Neck:  ?   Comments: Cervical Paraspinal Tenderness: C-5-C-6 ?Cardiovascular:  ?   Rate and  Rhythm: Normal rate and regular rhythm.  ?   Pulses: Normal pulses.  ?   Heart sounds: Normal heart sounds.  ?Pulmonary:  ?   Effort: Pulmonary effort is normal.  ?   Breath sounds: Normal breath sounds.  ?Musculoskeletal:  ?   Cervical back: Normal range of motion and neck supple.  ?   Comments: Normal Muscle Bulk and Muscle Testing Reveals:  ?Upper Extremities: Decreased ROM 90 Degrees  and Muscle Strength  4/5 ?Thoracic Paraspinal Tenderness: T-1-T-2 ?Lumbar Paraspinal Tenderness: L-4-L-5 ?Lower Extremities: Full ROM and Muscle Strength 5/5 ?Arises from Table with Ease  ?Narrow Based Gait  ?   ?Skin: ?   General: Skin is warm and dry.  ?Neurological:  ?   Mental Status: He is alert and oriented to person, place, and time.  ?Psychiatric:     ?   Mood and Affect: Mood normal.     ?   Behavior: Behavior normal.  ? ? ? ? ?   ?Assessment & Plan:  ?1.Cervicalgia/  Cervical Spondylosis with Radiculopathy: Continue HEP as Tolerated. Continue Lamictal. 09/29/2021. 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 Continue HEP as Tolerated .09/29/2021. ?3. Injury of right shoulder and right  upper arm. Continue HEP as Tolerated. Continue to Monitor. 09/29/2021. ?4. Chronic Pain Syndrome:Continue: RX: Oxycodone 15 mg one tablet 4 times a day as needed for pain #120 . Discontinue  Oxymorphone 10 mg one tablet every 6 hours as needed for pain # 120 due lack of supply at several pharmacies. In the past he was prescribed Morphine and had reaction with nausea,and vomiting. Oxymorphone ER was discontinued due to side effects. 09/29/2021. ?We will continue the opioid monitoring program, this consists of regular clinic visits, examinations, urine drug screen, pill counts as well as use of [place] Controlled Substance Reporting system. A 12 month History has been reviewed on the [place] Controlled Substance Reporting System on 09/29/2021. ?5. Left Knee Pain: No Complaints Today. Continue  HEP as Tolerated. Continue to Monitor. 09/29/2021 ?6. Neuropathic Pain: Continue Lamictal  He had NCS with Dr Wynn Banker in 12/22 Continue to Monitor. 09/29/2021 ?  ?F/U in 1 month. ?  ?  ? ?

## 2021-10-01 ENCOUNTER — Ambulatory Visit: Payer: Medicare HMO | Admitting: Physical Medicine & Rehabilitation

## 2021-10-09 ENCOUNTER — Ambulatory Visit: Payer: Medicare HMO | Admitting: Registered Nurse

## 2021-10-28 ENCOUNTER — Telehealth: Payer: Self-pay | Admitting: Registered Nurse

## 2021-10-28 MED ORDER — OXYCODONE HCL 15 MG PO TABS: 15.0000 mg | ORAL_TABLET | Freq: Four times a day (QID) | ORAL | 0 refills | Status: DC | PRN

## 2021-10-28 NOTE — Telephone Encounter (Signed)
PMP was Reviewed.  ?Oxycodone e-scribed today, [name] is aware via My-Chart message.  ?

## 2021-11-12 ENCOUNTER — Encounter: Payer: Medicare HMO | Attending: Physical Medicine & Rehabilitation | Admitting: Physical Medicine & Rehabilitation

## 2021-11-12 ENCOUNTER — Other Ambulatory Visit (HOSPITAL_COMMUNITY): Payer: Self-pay

## 2021-11-12 ENCOUNTER — Encounter: Payer: Self-pay | Admitting: Physical Medicine & Rehabilitation

## 2021-11-12 VITALS — BP 157/94 | HR 81 | Ht 70.0 in | Wt 204.0 lb

## 2021-11-12 DIAGNOSIS — G5602 Carpal tunnel syndrome, left upper limb: Secondary | ICD-10-CM

## 2021-11-12 DIAGNOSIS — M4726 Other spondylosis with radiculopathy, lumbar region: Secondary | ICD-10-CM | POA: Diagnosis present

## 2021-11-12 MED ORDER — OXYCODONE HCL 15 MG PO TABS
15.0000 mg | ORAL_TABLET | Freq: Four times a day (QID) | ORAL | 0 refills | Status: DC | PRN
  Filled 2021-11-12: qty 120, 30d supply, fill #0

## 2021-11-12 NOTE — Progress Notes (Signed)
? ?Subjective:  ? ? Patient ID: Erik Taylor, male    DOB: 1955/11/17, 66 y.o.   MRN: [id number] ? ?HPI ?Erik Taylor is back regarding his chronic pain syndrome.  I last saw him couple months back we injected his left carpal tunnel with corticosteroid.  He had great results with the injection and states that his pain has resolved.  He may occasionally feel little bit of tingling when he sleeping at night.  The last time he did, he was not wearing his brace as he was advised to do.  He generally does wear his brace a lot at night however. ?He had some problems finding Opana immediate release we switched him back to regular oxycodone 15 mg which she is using every 6 hours as needed.  He does not seem to last quite as long as the Opana but seems to generally control his pain.  Asked him if he is doing any other things for pain between his doses he says he does use ice and heat at times. ? ? ? ? ?Pain Inventory ?Average Pain 7 ?Pain Right Now 8 ?My pain is intermittent, constant, sharp, burning, dull, stabbing, tingling, and aching ? ?In the last 24 hours, has pain interfered with the following? ?General activity 5 ?Relation with others 5 ?Enjoyment of life 5 ?What TIME of day is your pain at its worst? daytime ?Sleep (in general) Poor ? ?Pain is worse with: walking, bending, sitting, inactivity, standing, and some activites ?Pain improves with: rest, heat/ice, therapy/exercise, medication, and injections ?Relief from Meds: 6 ? ?Family History  ?Problem Relation Age of Onset  ? Stroke Mother   ? Heart disease Father   ? Heart disease Sister   ? Kidney disease Brother   ? ?Social History  ? ?Socioeconomic History  ? Marital status: Married  ?  Spouse name: Not on file  ? Number of children: Not on file  ? Years of education: Not on file  ? Highest education level: Not on file  ?Occupational History  ? Not on file  ?Tobacco Use  ? Smoking status: Former  ?  Types: Cigarettes  ?  Quit date: 02/01/1994  ?  Years since quitting:  27.7  ? Smokeless tobacco: Never  ?Vaping Use  ? Vaping Use: Never used  ?Substance and Sexual Activity  ? Alcohol use: Never  ? Drug use: Never  ? Sexual activity: Not on file  ?Other Topics Concern  ? Not on file  ?Social History Narrative  ? Not on file  ? ?Social Determinants of Health  ? ?Financial Resource Strain: Not on file  ?Food Insecurity: Not on file  ?Transportation Needs: Not on file  ?Physical Activity: Not on file  ?Stress: Not on file  ?Social Connections: Not on file  ? ?Past Surgical History:  ?Procedure Laterality Date  ? arm nere repair    ? nerve repair  ? TONSILLECTOMY    ? ?Past Surgical History:  ?Procedure Laterality Date  ? arm nere repair    ? nerve repair  ? TONSILLECTOMY    ? ?Past Medical History:  ?Diagnosis Date  ? Arthritis   ? Diabetes mellitus without complication (HCC)   ? Neuromuscular disorder (HCC)   ? ?BP (!) 157/94   Pulse 81   Ht 5\' 10"  (1.778 m)   Wt 204 lb (92.5 kg)   SpO2 99%   BMI 29.27 kg/m?  ? ?Opioid Risk Score:   ?Fall Risk Score:  `1 ? ?Depression screen PHQ  2/9 ? ? ?  09/29/2021  ?  8:07 AM 09/10/2021  ?  8:48 AM 08/13/2021  ? 10:09 AM 08/11/2021  ?  8:53 AM 05/19/2021  ? 10:44 AM 04/25/2021  ?  9:20 AM 03/26/2021  ? 10:11 AM  ?Depression screen PHQ 2/9  ?Decreased Interest 1 0 1 1 0 1 0  ?Down, Depressed, Hopeless 1 0 1 1 0 1 0  ?PHQ - 2 Score 2 0 2 2 0 2 0  ?  ? ? ?Review of Systems  ?Musculoskeletal:  Positive for back pain.  ?     Right arm pain  ?All other systems reviewed and are negative. ? ?   ?Objective:  ? Physical Exam ?General: No acute distress ?HEENT: NCAT, EOMI, oral membranes moist ?Cards: reg rate  ?Chest: normal effort ?Abdomen: Soft, NT, ND ?Skin: dry, intact ?Extremities: no edema ?Psych: pleasant and appropriate  ?Musculoskeletal: functional ROM in neck. Tender along mid to lower neck with palpation. Tine's sign neg at left wrist.  ?  ?Neurological: He is alert and oriented to person, place, and time. RUE limited due to pain. Strength generally  4-5/5 ?   ?  ?  ?  ?  ?  ?  ?   ?Assessment & Plan:  ?1. Cervical Spondylosis with Radiculopathy:    ?           -followed by NS ?2. Lumbar Spondylosis with Radiculopathy.  hep discussed ?3. Injury of right shoulder and right  upper arm. Continue HEP as Tolerated.     ?  ?4. Chronic Pain Syndrome: Continue Oxycodone 15 mg immediate release  every 6 hours as needed #120. ?          We will continue the controlled substance monitoring program, this consists of regular clinic visits, examinations, routine drug screening, pill counts as well as use of [place] Controlled Substance Reporting System. [place].   ?           -discussed other means to help with pain control including ice/heat/topicals ? -also needs to have mental ways to get away from pain as well, leisure activities, meditation and mindfulness ?  ?5. Left CTS:  ? -responded to CTS injection ? -wear splint at night and prn during the day ? -avoid exacerbating activities.  ? ?Fifteen minutes of face to face patient care time were spent during this visit. All questions were encouraged and answered.  Follow up with np in 2 mos ? ? ? ? ?    ? ? ?

## 2021-11-12 NOTE — Patient Instructions (Addendum)
PLEASE FEEL FREE TO CALL OUR OFFICE WITH ANY PROBLEMS OR QUESTIONS [phone]) ? ?YOU SHOULD BE WEARING YOUR LEFT WRIST WHEN YOU SLEEP AND AS NEEDED DURING THE DAY.  ? ?AVOID ACTIVITIES VIBRATING/REPEATING MOVEMENTS WITH WRISTS.  ?                ?

## 2021-11-24 ENCOUNTER — Telehealth: Payer: Self-pay | Admitting: *Deleted

## 2021-11-24 MED ORDER — OXYCODONE HCL 15 MG PO TABS: 15.0000 mg | ORAL_TABLET | Freq: Four times a day (QID) | ORAL | 0 refills | Status: DC | PRN

## 2021-11-24 NOTE — Telephone Encounter (Signed)
PMP was Reviewed.  Oxycodone e-scribed today.  Placed a call to Mr. Harold, he verbalizes understanding.

## 2021-11-24 NOTE — Telephone Encounter (Signed)
Erik Taylor said MyChart would not allow him to send a message. He is asking for a refill on his pain medication. He spoke with the pharmacy and they have it and they will hold it for him if we send in the rx (it is due 5/26)

## 2021-11-28 ENCOUNTER — Ambulatory Visit: Payer: Medicare HMO | Admitting: Registered Nurse

## 2021-12-09 IMAGING — MR MR CERVICAL SPINE W/O CM
4 of 5 series · 27 of 48 positions shown · non-contrast
Comparison: 02/06/2017

CLINICAL DATA: Cervical radiculopathy without red flag symptoms.
Neck pain radiating into the left arm.

EXAM:
MRI CERVICAL SPINE WITHOUT CONTRAST
TECHNIQUE: Multiplanar, multisequence MR imaging of the cervical spine was
performed. No intravenous contrast was administered.

[Series 5: T2 · sagittal · 3.0mm · 0.55mm/px · 6 of 17 slices shown (1 of 2)]
[im 1/17]
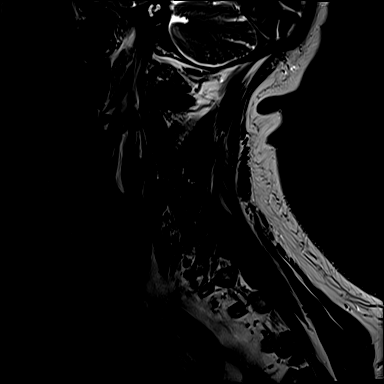
[im 4/17]
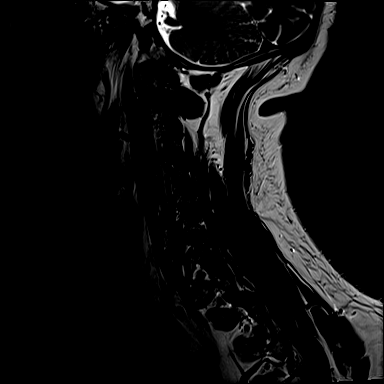
[im 7/17]
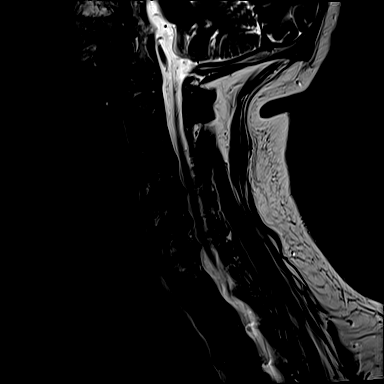
[im 10/17]
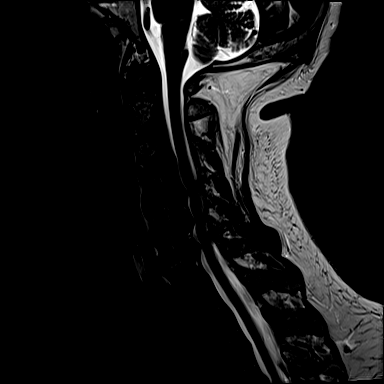
[im 13/17]
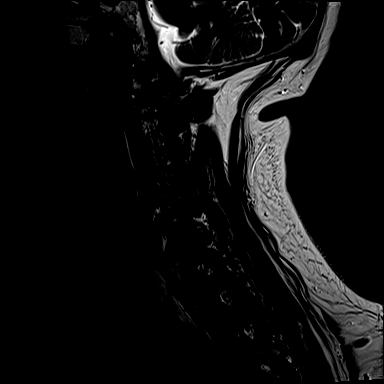
[im 17/17]
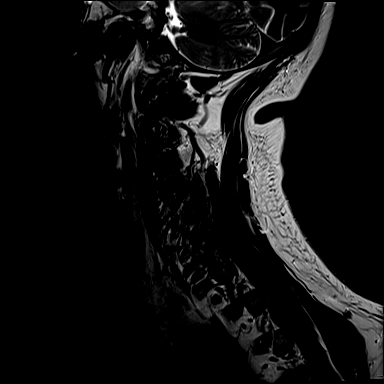

[Series 6: T1 · sagittal · 3.0mm · 0.66mm/px · 7 of 17 slices shown]
[im 1/17]
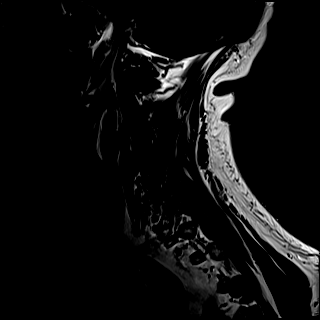
[im 3/17]
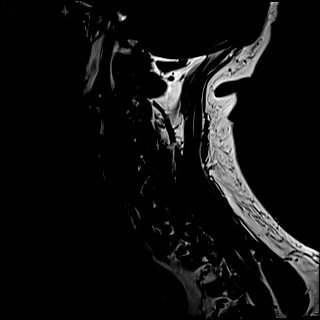
[im 6/17]
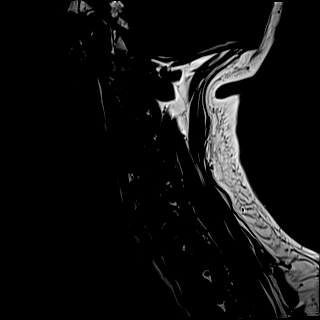
[im 9/17]
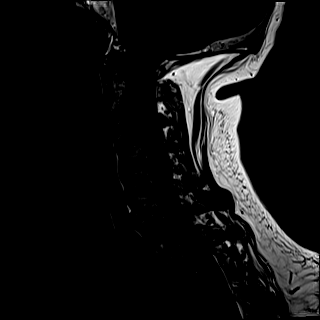
[im 11/17]
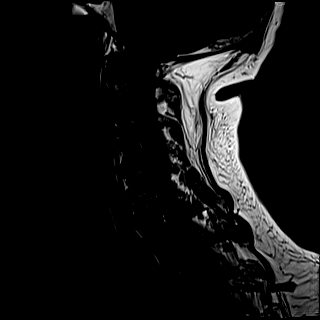
[im 14/17]
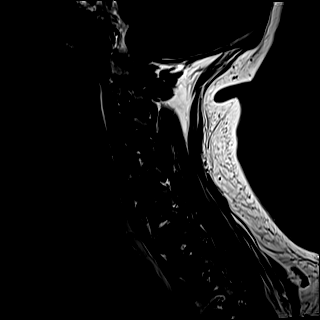
[im 17/17]
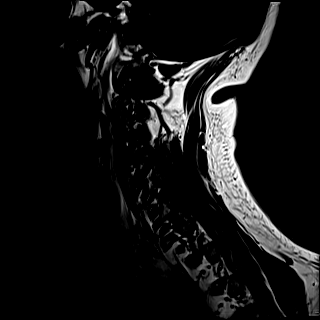

[Series 7: STIR · sagittal · 3.0mm · 0.33mm/px · 6 of 17 slices shown]
[im 1/17]
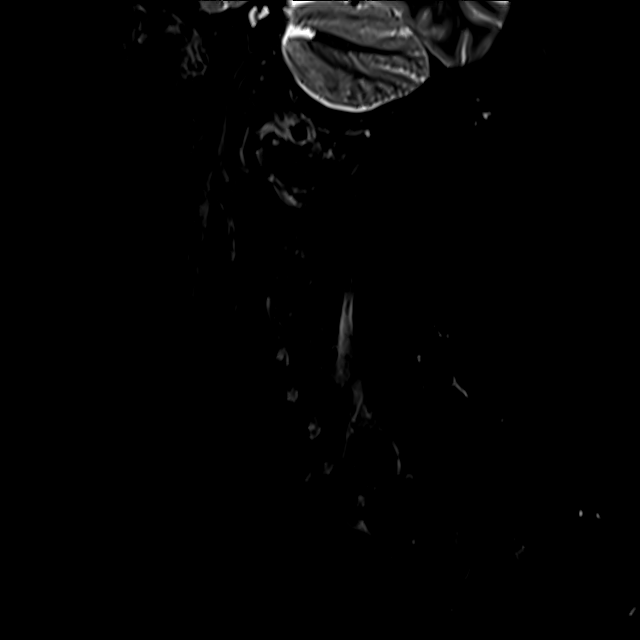
[im 3/17]
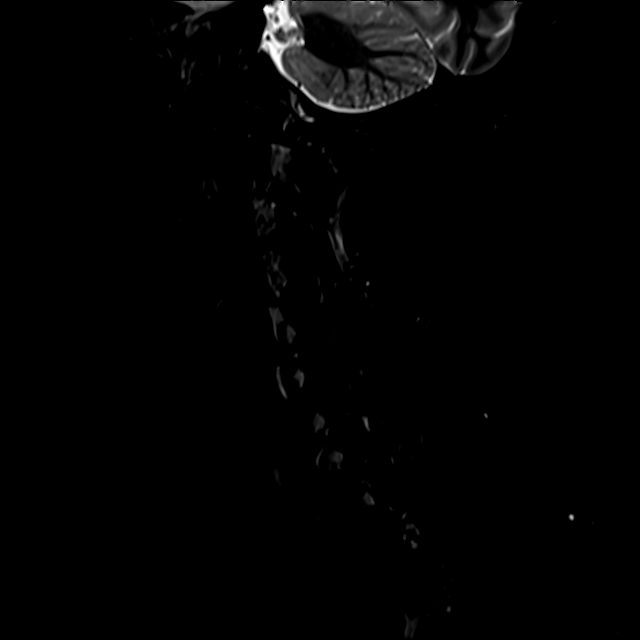
[im 6/17]
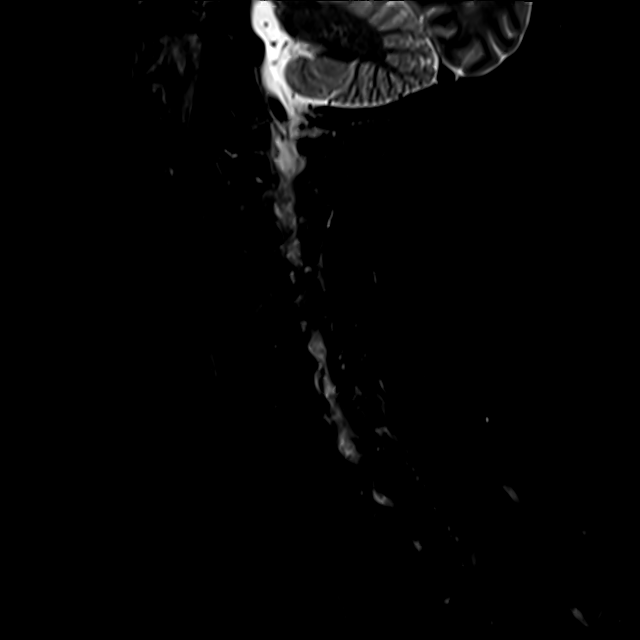
[im 9/17]
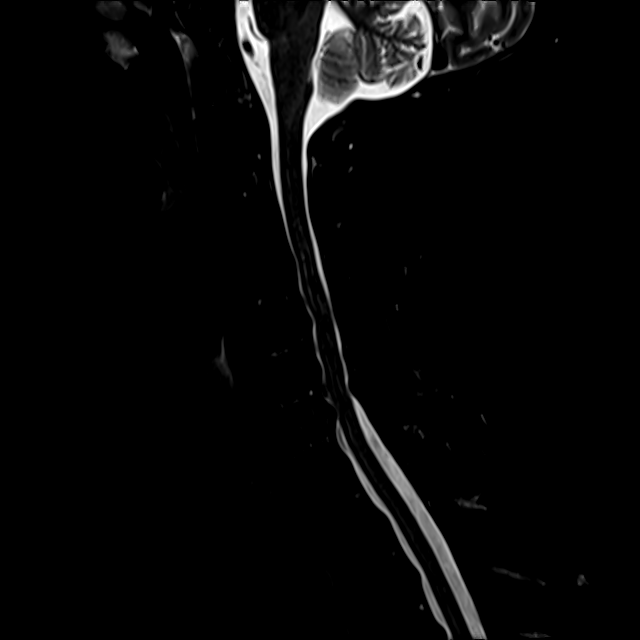
[im 11/17]
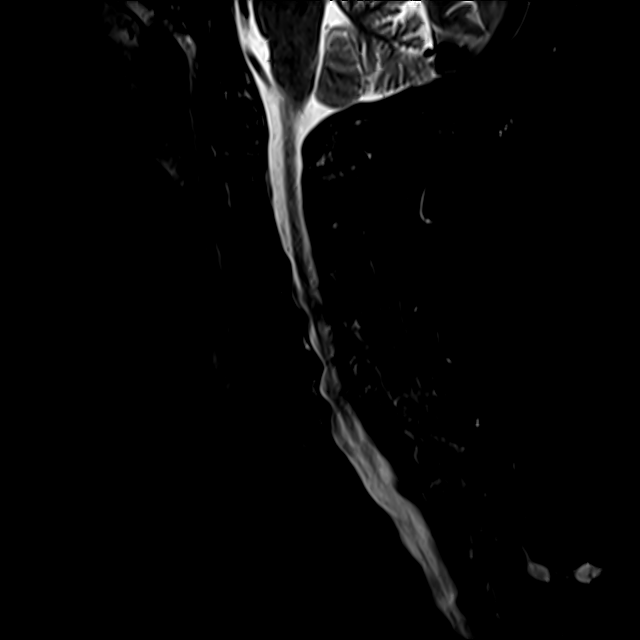
[im 14/17]
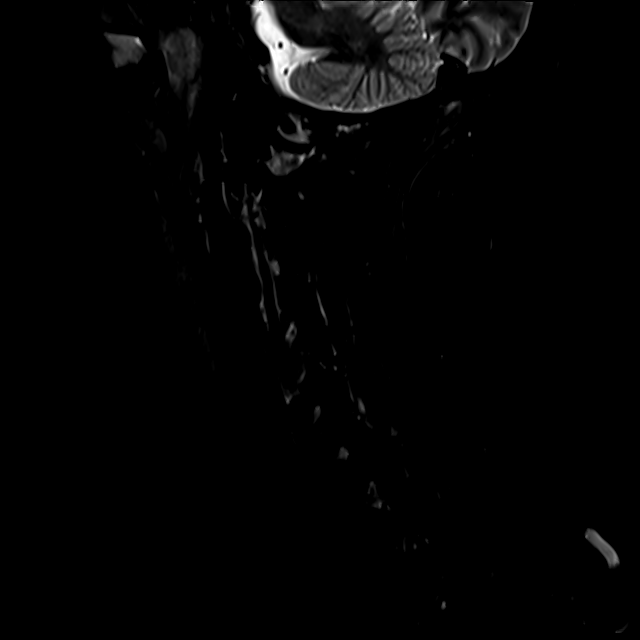

[Series 8: T2 · axial · 3.0mm · 0.50mm/px · z∈[-72,+32]mm · 8 of 34 slices shown (2 of 2)]
[im 1/34]
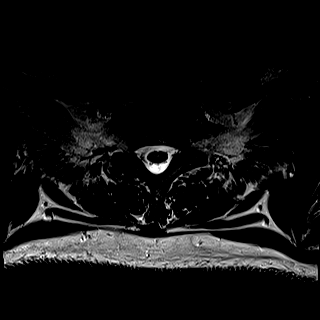
[im 6/34]
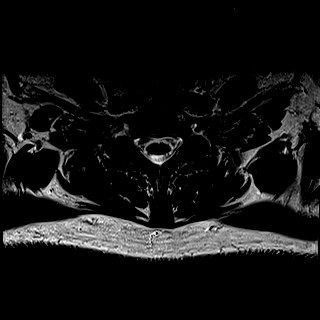
[im 11/34]
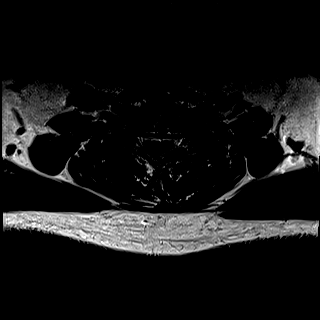
[im 16/34]
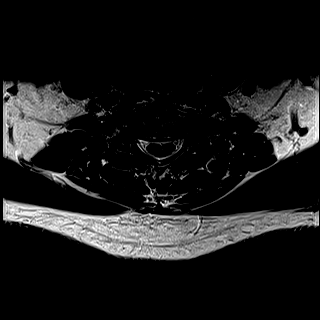
[im 18/34]
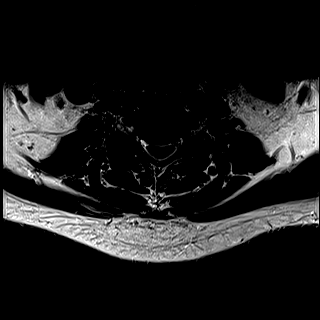
[im 23/34]
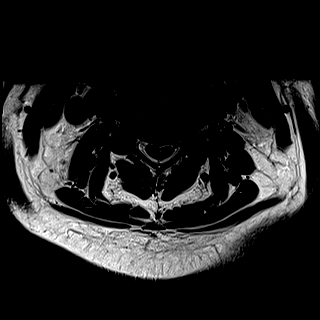
[im 28/34]
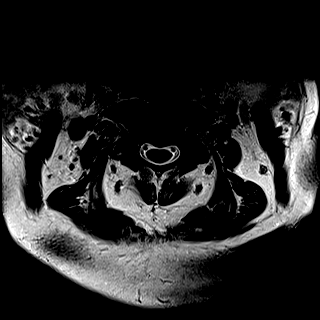
[im 34/34]
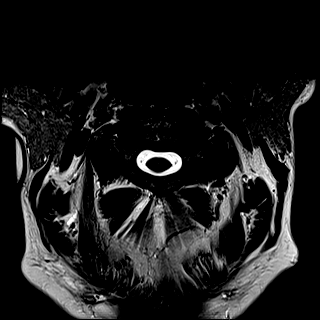

[27 of 48 positions shown; findings below may reference images not displayed]

FINDINGS: Alignment: Straightening of cervical lordosis with slight C7-T1
anterolisthesis.

Vertebrae: No fracture, evidence of discitis, or bone lesion.

Cord: Normal signal and morphology.

Posterior Fossa, vertebral arteries, paraspinal tissues: Negative.

Disc levels:

C2-3: Small central disc protrusion

C3-4: Disc narrowing and bulging with asymmetric left uncovertebral
and facet spurring. Advanced left foraminal stenosis.

C4-5: Disc narrowing and bulging with uncovertebral spurring. Disc
osteophyte complex straightens the ventral cord. Biforaminal
high-grade impingement

C5-6: Disc narrowing and bulging with right paracentral protrusion
and buttressing spur. Uncovertebral spurring. Spinal stenosis that
flattens the ventral cord. Advanced bilateral foraminal impingement

C6-7: Disc narrowing and bulging with uncovertebral spurring and
advanced bilateral foraminal impingement. Mild spinal stenosis.

C7-T1:Mild facet spurring.
IMPRESSION: 1. No significant or focal change since [DATE]. Diffuse cervical disc degeneration with foraminal impingement on
the left at C3-4 to C6-7 and on the right at C4-5 to C6-7.
3. Spinal stenosis with mild ventral cord deformity at C4-5 and
C5-6.

## 2021-12-24 ENCOUNTER — Telehealth: Payer: Self-pay | Admitting: Registered Nurse

## 2021-12-24 MED ORDER — OXYCODONE HCL 15 MG PO TABS: 15.0000 mg | ORAL_TABLET | Freq: Four times a day (QID) | ORAL | 0 refills | Status: DC | PRN

## 2021-12-24 NOTE — Telephone Encounter (Signed)
PMP was Reviewed.  Oxycodone e-scribed today.  Mr. Regas is aware via My- Chart Message.

## 2021-12-26 ENCOUNTER — Encounter: Payer: Medicare HMO | Admitting: Registered Nurse

## 2021-12-27 ENCOUNTER — Other Ambulatory Visit: Payer: Self-pay | Admitting: Physical Medicine & Rehabilitation

## 2021-12-27 DIAGNOSIS — G5602 Carpal tunnel syndrome, left upper limb: Secondary | ICD-10-CM

## 2021-12-27 DIAGNOSIS — M4722 Other spondylosis with radiculopathy, cervical region: Secondary | ICD-10-CM

## 2022-01-12 ENCOUNTER — Encounter: Payer: Medicare HMO | Attending: Physical Medicine & Rehabilitation | Admitting: Registered Nurse

## 2022-01-12 ENCOUNTER — Encounter: Payer: Self-pay | Admitting: Registered Nurse

## 2022-01-12 VITALS — BP 162/90 | HR 73 | Ht 70.0 in | Wt 205.0 lb

## 2022-01-12 DIAGNOSIS — Z5181 Encounter for therapeutic drug level monitoring: Secondary | ICD-10-CM | POA: Insufficient documentation

## 2022-01-12 DIAGNOSIS — Z79891 Long term (current) use of opiate analgesic: Secondary | ICD-10-CM | POA: Diagnosis not present

## 2022-01-12 DIAGNOSIS — G8929 Other chronic pain: Secondary | ICD-10-CM | POA: Insufficient documentation

## 2022-01-12 DIAGNOSIS — M542 Cervicalgia: Secondary | ICD-10-CM | POA: Diagnosis present

## 2022-01-12 DIAGNOSIS — G894 Chronic pain syndrome: Secondary | ICD-10-CM | POA: Diagnosis not present

## 2022-01-12 DIAGNOSIS — M792 Neuralgia and neuritis, unspecified: Secondary | ICD-10-CM | POA: Diagnosis present

## 2022-01-12 DIAGNOSIS — G5602 Carpal tunnel syndrome, left upper limb: Secondary | ICD-10-CM | POA: Insufficient documentation

## 2022-01-12 DIAGNOSIS — M545 Low back pain, unspecified: Secondary | ICD-10-CM | POA: Insufficient documentation

## 2022-01-12 DIAGNOSIS — M5412 Radiculopathy, cervical region: Secondary | ICD-10-CM | POA: Diagnosis not present

## 2022-01-12 MED ORDER — OXYCODONE HCL 15 MG PO TABS: 15.0000 mg | ORAL_TABLET | Freq: Four times a day (QID) | ORAL | 0 refills | Status: DC | PRN

## 2022-01-12 NOTE — Progress Notes (Signed)
Subjective:    Patient ID: Erik Taylor, male    DOB: 08/28/1955, 66 y.o.   MRN: [id number]  HPI: Erik Taylor is a 66 y.o. male who returns for follow up appointment for chronic pain and medication refill. He states his pain is located in his neck radiating into his right shoulder and lower back pain.He rates hs pain 9. His current exercise regime is walking and performing stretching exercises.  Ms. Trickett Morphine equivalent is 90.00 MME.   UDS ordered today.     Pain Inventory Average Pain 7 Pain Right Now 9 My pain is constant, sharp, burning, dull, stabbing, tingling, and aching  In the last 24 hours, has pain interfered with the following? General activity 7 Relation with others 5 Enjoyment of life 6 What TIME of day is your pain at its worst? daytime Sleep (in general) Poor  Pain is worse with: walking, bending, sitting, inactivity, standing, and some activites Pain improves with: rest, heat/ice, pacing activities, medication, and injections Relief from Meds: 6  Family History  Problem Relation Age of Onset   Stroke Mother    Heart disease Father    Heart disease Sister    Kidney disease Brother    Social History   Socioeconomic History   Marital status: Married    Spouse name: Not on file   Number of children: Not on file   Years of education: Not on file   Highest education level: Not on file  Occupational History   Not on file  Tobacco Use   Smoking status: Former    Types: Cigarettes    Quit date: 02/01/1994    Years since quitting: 27.9   Smokeless tobacco: Never  Vaping Use   Vaping Use: Never used  Substance and Sexual Activity   Alcohol use: Never   Drug use: Never   Sexual activity: Not on file  Other Topics Concern   Not on file  Social History Narrative   Not on file   Social Determinants of Health   Financial Resource Strain: Not on file  Food Insecurity: Not on file  Transportation Needs: Not on file  Physical Activity: Not  on file  Stress: Not on file  Social Connections: Not on file   Past Surgical History:  Procedure Laterality Date   arm nere repair     nerve repair   TONSILLECTOMY     Past Surgical History:  Procedure Laterality Date   arm nere repair     nerve repair   TONSILLECTOMY     Past Medical History:  Diagnosis Date   Arthritis    Diabetes mellitus without complication (HCC)    Neuromuscular disorder (HCC)    BP (!) 160/90   Ht 5\' 10"  (1.778 m)   Wt 205 lb (93 kg)   BMI 29.41 kg/m   Opioid Risk Score:   Fall Risk Score:  `1  Depression screen [facility] 2/9     01/12/2022    9:38 AM 09/29/2021    8:07 AM 09/10/2021    8:48 AM 08/13/2021   10:09 AM 08/11/2021    8:53 AM 05/19/2021   10:44 AM 04/25/2021    9:20 AM  Depression screen PHQ 2/9  Decreased Interest 0 1 0 1 1 0 1  Down, Depressed, Hopeless 0 1 0 1 1 0 1  PHQ - 2 Score 0 2 0 2 2 0 2    Review of Systems  Musculoskeletal:  Positive for back pain and  neck pain.       Pain in right shoulder down to right hand  All other systems reviewed and are negative.      Objective:   Physical Exam Vitals and nursing note reviewed.  Constitutional:      Appearance: Normal appearance.  Cardiovascular:     Rate and Rhythm: Normal rate and regular rhythm.     Pulses: Normal pulses.     Heart sounds: Normal heart sounds.  Pulmonary:     Effort: Pulmonary effort is normal.     Breath sounds: Normal breath sounds.  Musculoskeletal:     Cervical back: Normal range of motion and neck supple.     Comments: Normal Muscle Bulk and Muscle Testing Reveals:  Upper Extremities: Full ROM and Muscle Strength 5/5 Lumbar Paraspinal Tenderness: L-3-L-5 Lower Extremities: Full ROM and Muscle Strength 5/5 Arises from chair with ease Narrow Based  Gait     Skin:    General: Skin is warm and dry.  Neurological:     Mental Status: He is alert and oriented to person, place, and time.  Psychiatric:        Mood and Affect: Mood normal.         Behavior: Behavior normal.         Assessment & Plan:  1.Cervicalgia/  Cervical Spondylosis with Radiculopathy: Continue HEP as Tolerated. Continue Lamictal. 01/12/2022. 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 Continue HEP as Tolerated .01/12/2022. 3. Injury of right shoulder and right  upper arm. Continue HEP as Tolerated. Continue to Monitor. 01/12/2022. 4. Chronic Pain Syndrome:Continue: RX: Oxycodone 15 mg one tablet 4 times a day as needed for pain #120 . Discontinue  Oxymorphone 10 mg one tablet every 6 hours as needed for pain # 120 due lack of supply at several pharmacies. In the past he was prescribed Morphine and had reaction with nausea,and vomiting. Oxymorphone ER was discontinued due to side effects. 01/12/2022. We will continue the opioid monitoring program, this consists of regular clinic visits, examinations, urine drug screen, pill counts as well as use of [place] Controlled Substance Reporting system. A 12 month History has been reviewed on the [place] Controlled Substance Reporting System on 01/12/2022. 5. Left Knee Pain: No Complaints Today. Continue HEP as Tolerated. Continue to Monitor. 01/12/2022 6. Neuropathic Pain: Continue Lamictal  He had NCS with Dr Wynn Banker in 12/22 Continue to Monitor. 01/12/2022   F/U in 1 month.

## 2022-01-15 LAB — TOXASSURE SELECT,+ANTIDEPR,UR

## 2022-01-19 ENCOUNTER — Encounter: Payer: Self-pay | Admitting: Registered Nurse

## 2022-01-19 ENCOUNTER — Telehealth: Payer: Self-pay | Admitting: *Deleted

## 2022-01-19 NOTE — Telephone Encounter (Signed)
Urine drug screen for this encounter is consistent for prescribed medication 

## 2022-03-13 ENCOUNTER — Encounter: Payer: Self-pay | Admitting: Registered Nurse

## 2022-03-13 ENCOUNTER — Encounter: Payer: Medicare HMO | Attending: Physical Medicine & Rehabilitation | Admitting: Registered Nurse

## 2022-03-13 VITALS — BP 148/85 | HR 73 | Ht 70.0 in | Wt 204.4 lb

## 2022-03-13 DIAGNOSIS — Z79891 Long term (current) use of opiate analgesic: Secondary | ICD-10-CM | POA: Diagnosis present

## 2022-03-13 DIAGNOSIS — G5602 Carpal tunnel syndrome, left upper limb: Secondary | ICD-10-CM

## 2022-03-13 DIAGNOSIS — M542 Cervicalgia: Secondary | ICD-10-CM | POA: Diagnosis present

## 2022-03-13 DIAGNOSIS — G894 Chronic pain syndrome: Secondary | ICD-10-CM | POA: Diagnosis not present

## 2022-03-13 DIAGNOSIS — M4722 Other spondylosis with radiculopathy, cervical region: Secondary | ICD-10-CM

## 2022-03-13 DIAGNOSIS — M545 Low back pain, unspecified: Secondary | ICD-10-CM | POA: Diagnosis present

## 2022-03-13 DIAGNOSIS — M4726 Other spondylosis with radiculopathy, lumbar region: Secondary | ICD-10-CM | POA: Diagnosis not present

## 2022-03-13 DIAGNOSIS — G8929 Other chronic pain: Secondary | ICD-10-CM

## 2022-03-13 DIAGNOSIS — M792 Neuralgia and neuritis, unspecified: Secondary | ICD-10-CM

## 2022-03-13 DIAGNOSIS — M5412 Radiculopathy, cervical region: Secondary | ICD-10-CM | POA: Diagnosis present

## 2022-03-13 DIAGNOSIS — Z5181 Encounter for therapeutic drug level monitoring: Secondary | ICD-10-CM

## 2022-03-13 MED ORDER — OXYCODONE HCL 15 MG PO TABS
15.0000 mg | ORAL_TABLET | Freq: Four times a day (QID) | ORAL | 0 refills | Status: DC | PRN
Start: 2022-03-13 — End: 2022-04-17

## 2022-03-13 MED ORDER — OXYCODONE HCL 15 MG PO TABS: 15.0000 mg | ORAL_TABLET | Freq: Four times a day (QID) | ORAL | 0 refills | Status: DC | PRN

## 2022-03-13 NOTE — Progress Notes (Signed)
Subjective:    Patient ID: Erik Taylor, male    DOB: [birth year], 66 y.o.   MRN: [id number]  HPI: Erik Taylor is a 66 y.o. male who returns for follow up appointment for chronic pain and medication refill. He states his pain is located in his neck radiating into his right shoulder and lower back pain. He rates his pain 7. His current exercise regime is walking and performing stretching exercises.  [name] Morphine equivalent is 90.00 MME.   Last UDS on 01/12/2022, it was consistent.    Pain Inventory Average Pain 8 Pain Right Now 7 My pain is sharp, burning, dull, stabbing, tingling, and aching  In the last 24 hours, has pain interfered with the following? General activity 8 Relation with others 5 Enjoyment of life 6 What TIME of day is your pain at its worst? daytime Sleep (in general) Poor  Pain is worse with: walking, bending, inactivity, standing, and some activites Pain improves with: rest, heat/ice, and medication Relief from Meds: 6  Family History  Problem Relation Age of Onset   Stroke Mother    Heart disease Father    Heart disease Sister    Kidney disease Brother    Social History   Socioeconomic History   Marital status: Married    Spouse name: Not on file   Number of children: Not on file   Years of education: Not on file   Highest education level: Not on file  Occupational History   Not on file  Tobacco Use   Smoking status: Former    Types: Cigarettes    Quit date: 02/01/1994    Years since quitting: 28.1   Smokeless tobacco: Never  Vaping Use   Vaping Use: Never used  Substance and Sexual Activity   Alcohol use: Never   Drug use: Never   Sexual activity: Not on file  Other Topics Concern   Not on file  Social History Narrative   Not on file   Social Determinants of Health   Financial Resource Strain: Not on file  Food Insecurity: Not on file  Transportation Needs: Not on file  Physical Activity: Not on file  Stress: Not on  file  Social Connections: Not on file   Past Surgical History:  Procedure Laterality Date   arm nere repair     nerve repair   TONSILLECTOMY     Past Surgical History:  Procedure Laterality Date   arm nere repair     nerve repair   TONSILLECTOMY     Past Medical History:  Diagnosis Date   Arthritis    Diabetes mellitus without complication (HCC)    Neuromuscular disorder (HCC)    BP (!) 148/85   Pulse 73   Ht 5\' 10"  (1.778 m)   Wt 204 lb 6.4 oz (92.7 kg)   SpO2 96%   BMI 29.33 kg/m   Opioid Risk Score:   Fall Risk Score:  `1  Depression screen [facility] 2/9     03/13/2022    8:47 AM 01/12/2022    9:38 AM 09/29/2021    8:07 AM 09/10/2021    8:48 AM 08/13/2021   10:09 AM 08/11/2021    8:53 AM 05/19/2021   10:44 AM  Depression screen PHQ 2/9  Decreased Interest 0 0 1 0 1 1 0  Down, Depressed, Hopeless 0 0 1 0 1 1 0  PHQ - 2 Score 0 0 2 0 2 2 0  Review of Systems  Musculoskeletal:  Positive for back pain.       Right shoulder pain Right arm pain   All other systems reviewed and are negative.     Objective:   Physical Exam Vitals and nursing note reviewed.  Constitutional:      Appearance: Normal appearance.  Neck:     Comments: Cervical Paraspinal Tenderness: C-5-C-6  Cardiovascular:     Rate and Rhythm: Normal rate and regular rhythm.     Pulses: Normal pulses.     Heart sounds: Normal heart sounds.  Pulmonary:     Effort: Pulmonary effort is normal.     Breath sounds: Normal breath sounds.  Musculoskeletal:     Cervical back: Normal range of motion and neck supple.     Comments: Normal Muscle Bulk and Muscle Testing Reveals:  Upper Extremities: Decreased ROM 90 Degrees  and Muscle Strength 5/5 Right AC Joint Tenderness Lumbar Paraspinal Tenderness: L-4-L-5 Lower Extremities: Full ROM and Muscle Strength 5/5 Arises from Table with Ease Narrow Based  Gait     Skin:    General: Skin is warm and dry.  Neurological:     Mental Status: He is alert and  oriented to person, place, and time.  Psychiatric:        Mood and Affect: Mood normal.         Assessment & Plan:  1.Cervicalgia/  Cervical Spondylosis with Radiculopathy: Continue HEP as Tolerated. Continue Lamictal. 03/13/2022. 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 Continue HEP as Tolerated .03/13/2022. 3. Injury of right shoulder and right  upper arm. Continue HEP as Tolerated. Continue to Monitor. 03/13/2022. 4. Chronic Pain Syndrome:Continue: RX: Oxycodone 15 mg one tablet 4 times a day as needed for pain #120 . Discontinue  Oxymorphone 10 mg one tablet every 6 hours as needed for pain # 120 due lack of supply at several pharmacies. In the past he was prescribed Morphine and had reaction with nausea,and vomiting. Oxymorphone ER was discontinued due to side effects. 03/13/2022. We will continue the opioid monitoring program, this consists of regular clinic visits, examinations, urine drug screen, pill counts as well as use of [place] Controlled Substance Reporting system. A 12 month History has been reviewed on the [place] Controlled Substance Reporting System on 03/13/2022. 5. Left Knee Pain: No Complaints Today. Continue HEP as Tolerated. Continue to Monitor. 09/082023 6. Neuropathic Pain: Continue Lamictal  He had NCS with Dr Wynn Banker in 12/22 Continue to Monitor. 03/13/2022   F/U in 2 months.

## 2022-03-31 ENCOUNTER — Other Ambulatory Visit: Payer: Self-pay | Admitting: Physical Medicine & Rehabilitation

## 2022-03-31 DIAGNOSIS — G5602 Carpal tunnel syndrome, left upper limb: Secondary | ICD-10-CM

## 2022-03-31 DIAGNOSIS — M4722 Other spondylosis with radiculopathy, cervical region: Secondary | ICD-10-CM

## 2022-04-17 ENCOUNTER — Telehealth: Payer: Self-pay | Admitting: Registered Nurse

## 2022-04-17 MED ORDER — OXYCODONE HCL 15 MG PO TABS: 15.0000 mg | ORAL_TABLET | Freq: Four times a day (QID) | ORAL | 0 refills | Status: DC | PRN

## 2022-04-17 NOTE — Telephone Encounter (Signed)
PMP was Reviewed.  Oxycodone e-scribed today, new prescription sent to pharmacy. Pharmacist called stating the previous prescription was canceled.

## 2022-04-17 NOTE — Telephone Encounter (Signed)
PMP was Reviewed.  [facility] was called , they will fill his oxycodone today. Mr. Inskeep leaving for a [place] today.Call placed to Mr. Rayner he is aware of the above.

## 2022-04-17 NOTE — Telephone Encounter (Signed)
Patient wants to speak to you about his medication-he has to go out of town and needs his meds before he leaves.  Please call.

## 2022-05-07 ENCOUNTER — Other Ambulatory Visit: Payer: Self-pay

## 2022-05-07 MED ORDER — OXYCODONE HCL 15 MG PO TABS
15.0000 mg | ORAL_TABLET | Freq: Four times a day (QID) | ORAL | 0 refills | Status: DC | PRN
Start: 2022-05-07 — End: 2022-06-08

## 2022-05-07 NOTE — Telephone Encounter (Signed)
PMP was Reviewed.  Oxycodone e-scribed to pharmacy. Mr. Panuco is aware via My-Chart message.

## 2022-05-07 NOTE — Telephone Encounter (Signed)
Erik Taylor is will be out of his Oxycodone 15 mg on Tuesday. [name] can fill the remainder of his last RX. Per patient the medication is on hold for him. Please send.   Call back ph [phone].  Filled  Written  ID  Drug  QTY  Days  Prescriber  RX #  Dispenser  Refill  Daily Dose*  Pymt Type  PMP  04/17/2022 03/13/2022 2  Oxycodone Hcl (Ir) 15 Mg Tab 102.00 26 Eu Tho [id number] Sam ([id number]) 0/0 88.27 MME [place] 03/19/2022 03/13/2022 2  Oxycodone Hcl (Ir) 15 Mg Tab 120.00 30 Eu Tho [id number] Sam ([id number]) 0/0 90.00 MME [place] 02/20/2022 01/12/2022 2  Oxycodone Hcl (Ir) 15 Mg Tab 120.00 30 Eu Tho [id number] Sam ([id number]) 0/0 90.00 MME [place]

## 2022-05-15 ENCOUNTER — Encounter: Payer: Medicare HMO | Admitting: Registered Nurse

## 2022-06-08 ENCOUNTER — Telehealth: Payer: Self-pay | Admitting: *Deleted

## 2022-06-08 ENCOUNTER — Other Ambulatory Visit (HOSPITAL_COMMUNITY): Payer: Self-pay

## 2022-06-08 ENCOUNTER — Other Ambulatory Visit: Payer: Self-pay | Admitting: Physical Medicine and Rehabilitation

## 2022-06-08 ENCOUNTER — Encounter: Payer: Medicare HMO | Admitting: Registered Nurse

## 2022-06-08 MED ORDER — OXYCODONE HCL 15 MG PO TABS
15.0000 mg | ORAL_TABLET | Freq: Four times a day (QID) | ORAL | 0 refills | Status: DC | PRN
  Filled 2022-06-08: qty 120, 30d supply, fill #0

## 2022-06-08 MED ORDER — OXYCODONE HCL 15 MG PO TABS: 15.0000 mg | ORAL_TABLET | Freq: Four times a day (QID) | ORAL | 0 refills | Status: DC | PRN

## 2022-06-08 NOTE — Telephone Encounter (Signed)
Please send his refill (for [place] ) of oxycodone 15 mg IR #120 to [facility] on [address] ( I have put the pharmacy in for you)  He is out today.

## 2022-06-09 MED ORDER — OXYCODONE HCL 15 MG PO TABS: 15.0000 mg | ORAL_TABLET | Freq: Four times a day (QID) | ORAL | 0 refills | Status: DC | PRN

## 2022-06-09 NOTE — Telephone Encounter (Signed)
Rx at [facility] has been cancelled.

## 2022-06-09 NOTE — Telephone Encounter (Signed)
Please resend Mr. Steinhoff Oxycodone to [facility] [address] in [place]. Per patient it is in stock there.   (Prior Rx will be cancelled at [facility]).

## 2022-06-09 NOTE — Telephone Encounter (Signed)
PMP was Reviewed.  Oxycodone e-scribed to Pharmacy. Call placed to Erik Taylor regarding the above, he verbalizes understanding.

## 2022-07-07 ENCOUNTER — Encounter: Payer: Self-pay | Admitting: Registered Nurse

## 2022-07-07 ENCOUNTER — Encounter: Payer: Medicare HMO | Attending: Registered Nurse | Admitting: Registered Nurse

## 2022-07-07 VITALS — BP 142/84 | HR 86 | Ht 70.0 in | Wt 193.0 lb

## 2022-07-07 DIAGNOSIS — M5412 Radiculopathy, cervical region: Secondary | ICD-10-CM

## 2022-07-07 DIAGNOSIS — M542 Cervicalgia: Secondary | ICD-10-CM | POA: Diagnosis present

## 2022-07-07 DIAGNOSIS — G44029 Chronic cluster headache, not intractable: Secondary | ICD-10-CM | POA: Diagnosis present

## 2022-07-07 DIAGNOSIS — Z79891 Long term (current) use of opiate analgesic: Secondary | ICD-10-CM

## 2022-07-07 DIAGNOSIS — M5416 Radiculopathy, lumbar region: Secondary | ICD-10-CM | POA: Diagnosis not present

## 2022-07-07 DIAGNOSIS — Z5181 Encounter for therapeutic drug level monitoring: Secondary | ICD-10-CM | POA: Diagnosis not present

## 2022-07-07 DIAGNOSIS — G894 Chronic pain syndrome: Secondary | ICD-10-CM

## 2022-07-07 MED ORDER — OXYCODONE HCL 15 MG PO TABS: 15.0000 mg | ORAL_TABLET | Freq: Four times a day (QID) | ORAL | 0 refills | Status: DC | PRN

## 2022-07-07 MED ORDER — OXYCODONE HCL 15 MG PO TABS
15.0000 mg | ORAL_TABLET | Freq: Four times a day (QID) | ORAL | 0 refills | Status: DC | PRN
Start: 2022-07-07 — End: 2022-07-07

## 2022-07-07 NOTE — Progress Notes (Signed)
Subjective:    Patient ID: Erik Taylor, male    DOB: 06-Aug-1955, 67 y.o.   MRN: [id number]  HPI: Erik Taylor is a 67 y.o. male who returns for follow up appointment for chronic pain and medication refill. He states his pain is located in his neck radiating into his left shoulder and lower back pain radiating into his right buttock and right lower extremity. Also reports he has a chronic headache. He rates his pain 9. His current exercise regime is walking and performing stretching exercises.  [name] Morphine equivalent is 90.00 MME.   UDS ordered today.   Pain Inventory Average Pain 7 Pain Right Now 9 My pain is sharp, burning, dull, stabbing, tingling, and aching  In the last 24 hours, has pain interfered with the following? General activity 7 Relation with others 5 Enjoyment of life 6 What TIME of day is your pain at its worst? daytime Sleep (in general) Poor  Pain is worse with: walking, bending, sitting, inactivity, standing, and some activites Pain improves with: rest, heat/ice, and medication Relief from Meds: 7  Family History  Problem Relation Age of Onset   Stroke Mother    Heart disease Father    Heart disease Sister    Kidney disease Brother    Social History   Socioeconomic History   Marital status: Married    Spouse name: Not on file   Number of children: Not on file   Years of education: Not on file   Highest education level: Not on file  Occupational History   Not on file  Tobacco Use   Smoking status: Former    Types: Cigarettes    Quit date: 02/01/1994    Years since quitting: 28.4   Smokeless tobacco: Never  Vaping Use   Vaping Use: Never used  Substance and Sexual Activity   Alcohol use: Never   Drug use: Never   Sexual activity: Not on file  Other Topics Concern   Not on file  Social History Narrative   Not on file   Social Determinants of Health   Financial Resource Strain: Not on file  Food Insecurity: Not on file   Transportation Needs: Not on file  Physical Activity: Not on file  Stress: Not on file  Social Connections: Not on file   Past Surgical History:  Procedure Laterality Date   arm nere repair     nerve repair   TONSILLECTOMY     Past Surgical History:  Procedure Laterality Date   arm nere repair     nerve repair   TONSILLECTOMY     Past Medical History:  Diagnosis Date   Arthritis    Diabetes mellitus without complication ([place])    Neuromuscular disorder ([place])    There were no vitals taken for this visit.  Opioid Risk Score:   Fall Risk Score:  `1  Depression screen [facility] 2/9     03/13/2022    8:47 AM 01/12/2022    9:38 AM 09/29/2021    8:07 AM 09/10/2021    8:48 AM 08/13/2021   10:09 AM 08/11/2021    8:53 AM 05/19/2021   10:44 AM  Depression screen PHQ 2/9  Decreased Interest 0 0 1 0 1 1 0  Down, Depressed, Hopeless 0 0 1 0 1 1 0  PHQ - 2 Score 0 0 2 0 2 2 0    Review of Systems  Musculoskeletal:  Positive for back pain.  B/L shoulder pain Right arm/hand pain  All other systems reviewed and are negative.     Objective:   Physical Exam Vitals and nursing note reviewed.  Constitutional:      Appearance: Normal appearance.  Neck:     Comments: Cervical Paraspinal Tenderness: C-4-C-5 Mainly Left Side  Cardiovascular:     Rate and Rhythm: Normal rate and regular rhythm.     Pulses: Normal pulses.     Heart sounds: Normal heart sounds.  Pulmonary:     Effort: Pulmonary effort is normal.     Breath sounds: Normal breath sounds.  Musculoskeletal:     Cervical back: Normal range of motion and neck supple.     Comments: Normal Muscle Bulk and Muscle Testing Reveals:  Upper Extremities: Right: Decreased ROM 90 Degrees and Muscle Strength 4/5 Left Upper Extremity: Full ROM and Muscle Strength 5/5 Left AC Joint Tenderness  Lumbar Paraspinal Tenderness: L-3-L-5 Lower Extremities: Full ROM and Muscle Strength 5/5 Arises from Chair with ease Narrow Based  Gait      Skin:    General: Skin is warm and dry.  Neurological:     Mental Status: He is alert and oriented to person, place, and time.  Psychiatric:        Mood and Affect: Mood normal.        Behavior: Behavior normal.         Assessment & Plan:  1.Cervicalgia/  Cervical Spondylosis with Radiculopathy: Continue HEP as Tolerated. Continue Lamictal. 07/07/2022. 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 Continue HEP as Tolerated .07/07/2022 3. Injury of right shoulder and right  upper arm. Continue HEP as Tolerated. Continue to Monitor. 07/07/2022. 4. Chronic Pain Syndrome:Continue: Refilled: Oxycodone 15 mg one tablet 4 times a day as needed for pain #120 .We will continue the opioid monitoring program, this consists of regular clinic visits, examinations, urine drug screen, pill counts as well as use of [place] Controlled Substance Reporting system. A 12 month History has been reviewed on the [place] on 07/07/2022. 5. Left Knee Pain: No Complaints Today. Continue HEP as Tolerated. Continue to Monitor. 07/07/2022 6. Neuropathic Pain: Continue Lamictal  He had NCS with Dr Letta Pate in 12/22 Continue to Monitor. 07/07/2022   F/U in 2 months.

## 2022-07-08 ENCOUNTER — Other Ambulatory Visit: Payer: Self-pay | Admitting: Physical Medicine & Rehabilitation

## 2022-07-08 DIAGNOSIS — M4722 Other spondylosis with radiculopathy, cervical region: Secondary | ICD-10-CM

## 2022-07-08 DIAGNOSIS — G5602 Carpal tunnel syndrome, left upper limb: Secondary | ICD-10-CM

## 2022-07-09 ENCOUNTER — Encounter: Payer: Medicare HMO | Admitting: Registered Nurse

## 2022-07-09 LAB — TOXASSURE SELECT,+ANTIDEPR,UR

## 2022-08-10 ENCOUNTER — Other Ambulatory Visit: Payer: Self-pay | Admitting: Physical Medicine & Rehabilitation

## 2022-08-10 DIAGNOSIS — G5602 Carpal tunnel syndrome, left upper limb: Secondary | ICD-10-CM

## 2022-08-10 DIAGNOSIS — M4722 Other spondylosis with radiculopathy, cervical region: Secondary | ICD-10-CM

## 2022-09-04 ENCOUNTER — Encounter: Payer: Self-pay | Admitting: Registered Nurse

## 2022-09-04 ENCOUNTER — Encounter: Payer: Medicare PPO | Attending: Registered Nurse | Admitting: Registered Nurse

## 2022-09-04 VITALS — BP 141/91 | HR 80 | Ht 70.0 in | Wt 201.8 lb

## 2022-09-04 DIAGNOSIS — M542 Cervicalgia: Secondary | ICD-10-CM | POA: Diagnosis not present

## 2022-09-04 DIAGNOSIS — Z79891 Long term (current) use of opiate analgesic: Secondary | ICD-10-CM | POA: Diagnosis present

## 2022-09-04 DIAGNOSIS — Z5181 Encounter for therapeutic drug level monitoring: Secondary | ICD-10-CM | POA: Diagnosis present

## 2022-09-04 DIAGNOSIS — M5412 Radiculopathy, cervical region: Secondary | ICD-10-CM

## 2022-09-04 DIAGNOSIS — M5416 Radiculopathy, lumbar region: Secondary | ICD-10-CM

## 2022-09-04 DIAGNOSIS — G894 Chronic pain syndrome: Secondary | ICD-10-CM

## 2022-09-04 MED ORDER — OXYCODONE HCL 15 MG PO TABS: 15.0000 mg | ORAL_TABLET | Freq: Four times a day (QID) | ORAL | 0 refills | Status: DC | PRN

## 2022-09-04 NOTE — Progress Notes (Signed)
Subjective:    Patient ID: Erik Taylor, male    DOB: 1955-12-28, 67 y.o.   MRN: [id number]  HPI: [name] is a 67 y.o. male who returns for follow up appointment for chronic pain and medication refill. He states his pain is located in his neck radiating into left shoulder and lower back pain radiating into his right buttock. He rates his pain 8. His current exercise regime is walking and performing stretching exercises. [name] arrived to office with elevated blood pressure, blood pressure was re-checked. He's not on any anti-hypertensive medication, he will F/U with his PCP.   [name] Morphine equivalent is 90.00 MME.   Last UDS was Performed on 07/07/2022, it was consistent.     Pain Inventory Average Pain 7 Pain Right Now 8 My pain is sharp, burning, dull, stabbing, tingling, and aching  In the last 24 hours, has pain interfered with the following? General activity 7 Relation with others 5 Enjoyment of life 6 What TIME of day is your pain at its worst? daytime Sleep (in general) Poor  Pain is worse with: walking, bending, sitting, inactivity, standing, and some activites Pain improves with: rest, heat/ice, and medication Relief from Meds: 7  Family History  Problem Relation Age of Onset   Stroke Mother    Heart disease Father    Heart disease Sister    Kidney disease Brother    Social History   Socioeconomic History   Marital status: Married    Spouse name: Not on file   Number of children: Not on file   Years of education: Not on file   Highest education level: Not on file  Occupational History   Not on file  Tobacco Use   Smoking status: Former    Types: Cigarettes    Quit date: 02/01/1994    Years since quitting: 28.6   Smokeless tobacco: Never  Vaping Use   Vaping Use: Never used  Substance and Sexual Activity   Alcohol use: Never   Drug use: Never   Sexual activity: Not on file  Other Topics Concern   Not on file  Social History  Narrative   Not on file   Social Determinants of Health   Financial Resource Strain: Not on file  Food Insecurity: Not on file  Transportation Needs: Not on file  Physical Activity: Not on file  Stress: Not on file  Social Connections: Not on file   Past Surgical History:  Procedure Laterality Date   arm nere repair     nerve repair   TONSILLECTOMY     Past Surgical History:  Procedure Laterality Date   arm nere repair     nerve repair   TONSILLECTOMY     Past Medical History:  Diagnosis Date   Arthritis    Diabetes mellitus without complication (HCC)    Neuromuscular disorder ([place])    BP (!) 151/90   Pulse 83   Ht '5\' 10"'$  (1.778 m)   Wt 201 lb 12.8 oz (91.5 kg)   SpO2 99%   BMI 28.96 kg/m   Opioid Risk Score:   Fall Risk Score:  `1  Depression screen [facility] 2/9     09/04/2022    9:24 AM 07/07/2022    8:56 AM 03/13/2022    8:47 AM 01/12/2022    9:38 AM 09/29/2021    8:07 AM 09/10/2021    8:48 AM 08/13/2021   10:09 AM  Depression screen PHQ 2/9  Decreased Interest  1 0 0 0 1 0 1  Down, Depressed, Hopeless 1 0 0 0 1 0 1  PHQ - 2 Score 2 0 0 0 2 0 2     Review of Systems  Constitutional: Negative.   HENT: Negative.    Eyes: Negative.   Respiratory: Negative.    Cardiovascular: Negative.   Gastrointestinal: Negative.   Endocrine: Negative.   Genitourinary: Negative.   Musculoskeletal:  Positive for arthralgias, back pain and neck pain.  Skin: Negative.   Allergic/Immunologic: Negative.   Neurological: Negative.   Hematological: Negative.   Psychiatric/Behavioral:  Positive for dysphoric mood.   All other systems reviewed and are negative.      Objective:   Physical Exam Vitals and nursing note reviewed.  Constitutional:      Appearance: Normal appearance.  Neck:     Comments: Cervical Paraspinal Tenderness: C-4-C-6 Mainly Left Side  Cardiovascular:     Rate and Rhythm: Normal rate and regular rhythm.     Pulses: Normal pulses.     Heart sounds: Normal  heart sounds.  Pulmonary:     Effort: Pulmonary effort is normal.     Breath sounds: Normal breath sounds.  Musculoskeletal:     Cervical back: Normal range of motion and neck supple.     Comments: Normal Muscle Bulk and Muscle Testing Reveals:  Upper Extremities: Full ROM and Muscle Strength 5/5 Left AC Joint Tenderness  Lumbar Paraspinal Tenderness: L-4-L-5 Lower Extremities: Full ROM and Muscle Strength 5/5 Arises from chair slowly Narrow Based  Gait     Skin:    General: Skin is warm and dry.  Neurological:     Mental Status: He is alert and oriented to person, place, and time.  Psychiatric:        Mood and Affect: Mood normal.        Behavior: Behavior normal.         Assessment & Plan:  1.Cervicalgia/  Cervical Spondylosis with Radiculopathy: Continue HEP as Tolerated. Continue Lamictal. 09/04/2022. 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 Continue HEP as Tolerated .09/04/2022 3. Injury of right shoulder and right  upper arm. Continue HEP as Tolerated. Continue to Monitor. 09/04/2022. 4. Chronic Pain Syndrome:Continue: Refilled: Oxycodone 15 mg one tablet 4 times a day as needed for pain #120 .We will continue the opioid monitoring program, this consists of regular clinic visits, examinations, urine drug screen, pill counts as well as use of [place] Controlled Substance Reporting system. A 12 month History has been reviewed on the [place] on 09/04/2022. 5. Left Knee Pain: No Complaints Today. Continue HEP as Tolerated. Continue to Monitor. 09/04/2022 6. Neuropathic Pain: Continue Lamictal  He had NCS with Dr Letta Pate in 12/22 Continue to Monitor. 09/04/2022   F/U in 2 months.

## 2022-09-15 ENCOUNTER — Other Ambulatory Visit: Payer: Self-pay | Admitting: Registered Nurse

## 2022-09-15 DIAGNOSIS — G5602 Carpal tunnel syndrome, left upper limb: Secondary | ICD-10-CM

## 2022-09-15 DIAGNOSIS — M4722 Other spondylosis with radiculopathy, cervical region: Secondary | ICD-10-CM

## 2022-10-05 ENCOUNTER — Other Ambulatory Visit: Payer: Self-pay

## 2022-10-05 NOTE — Telephone Encounter (Signed)
[facility] called and stated a partial refill on Oxycodone was dspensed on 10/03/22. The will have the remainder this week and patient is going to continue with this refill. No further action needed.

## 2022-10-05 NOTE — Telephone Encounter (Signed)
[facility] not have patient medication please send to [facility] in [place], I have updated the pharmacy in system, I called [facility] to cancel prescription already

## 2022-11-04 ENCOUNTER — Encounter: Payer: Self-pay | Admitting: Registered Nurse

## 2022-11-04 ENCOUNTER — Telehealth: Payer: Self-pay | Admitting: *Deleted

## 2022-11-04 ENCOUNTER — Encounter: Payer: Medicare PPO | Attending: Registered Nurse | Admitting: Registered Nurse

## 2022-11-04 VITALS — BP 150/92 | HR 94 | Ht 70.0 in | Wt 201.0 lb

## 2022-11-04 DIAGNOSIS — G894 Chronic pain syndrome: Secondary | ICD-10-CM | POA: Diagnosis present

## 2022-11-04 DIAGNOSIS — M5412 Radiculopathy, cervical region: Secondary | ICD-10-CM | POA: Insufficient documentation

## 2022-11-04 DIAGNOSIS — M542 Cervicalgia: Secondary | ICD-10-CM

## 2022-11-04 DIAGNOSIS — Z79891 Long term (current) use of opiate analgesic: Secondary | ICD-10-CM

## 2022-11-04 DIAGNOSIS — Z5181 Encounter for therapeutic drug level monitoring: Secondary | ICD-10-CM | POA: Insufficient documentation

## 2022-11-04 DIAGNOSIS — M5416 Radiculopathy, lumbar region: Secondary | ICD-10-CM | POA: Diagnosis present

## 2022-11-04 MED ORDER — OXYCODONE HCL 15 MG PO TABS: 15.0000 mg | ORAL_TABLET | Freq: Four times a day (QID) | ORAL | 0 refills | Status: DC | PRN

## 2022-11-04 NOTE — Progress Notes (Signed)
Subjective:    Patient ID: Erik Taylor, male    DOB: 09-Feb-1956, 67 y.o.   MRN: [id number]  HPI: Erik Taylor is a 68 y.o. male who returns for follow up appointment for chronic pain and medication refill. He states his pain is located in his neck radiating into his left shoulder and lower back pain radiating into his right buttock. He rates his pain 9. His current exercise regime is walking and performing stretching exercises.  Erik Taylor arrived hypertensive, blood pressure was re-checked. He's not prescribed anti-hypertensive medication, he will F/U with his PCP and keep a blood pressure log. He verbalizes understanding.   Erik Taylor Morphine equivalent is 90.00 MME.   UDS ordered today Spoke with Pharmacist from [facility] regarding PMP discrepancy [name] states " On 09/04/2022 Erik Taylor was dispensed  60 tablets of his Oxycodone 15 mg tablets.  On 09/07/2022. He was dispensed 60 tablets of Oxycodone 15 mg.  On 10/02/2022 He was dispensed 40 tablets of Oxycodone  15 mg On 10/05/2022 He was dispensed 80 Tablets.   Oxycodone prescription was e-scribed today, and Erik Taylor was called regarding prescription be sent to pharmacy at 10:10 . He verbalizes understanding.      Pain Inventory Average Pain 8 Pain Right Now 9 My pain is constant, sharp, burning, dull, stabbing, tingling, and aching  In the last 24 hours, has pain interfered with the following? General activity 6 Relation with others 5 Enjoyment of life 6 What TIME of day is your pain at its worst? daytime Sleep (in general) Poor  Pain is worse with: walking, bending, sitting, inactivity, standing, and some activites Pain improves with: rest, heat/ice, and medication Relief from Meds: 6  Family History  Problem Relation Age of Onset   Stroke Mother    Heart disease Father    Heart disease Sister    Kidney disease Brother    Social History   Socioeconomic History   Marital status: Married     Spouse name: Not on file   Number of children: Not on file   Years of education: Not on file   Highest education level: Not on file  Occupational History   Not on file  Tobacco Use   Smoking status: Former    Types: Cigarettes    Quit date: 02/01/1994    Years since quitting: 28.7   Smokeless tobacco: Never  Vaping Use   Vaping Use: Never used  Substance and Sexual Activity   Alcohol use: Never   Drug use: Never   Sexual activity: Not on file  Other Topics Concern   Not on file  Social History Narrative   Not on file   Social Determinants of Health   Financial Resource Strain: Not on file  Food Insecurity: Not on file  Transportation Needs: Not on file  Physical Activity: Not on file  Stress: Not on file  Social Connections: Not on file   Past Surgical History:  Procedure Laterality Date   arm nere repair     nerve repair   TONSILLECTOMY     Past Surgical History:  Procedure Laterality Date   arm nere repair     nerve repair   TONSILLECTOMY     Past Medical History:  Diagnosis Date   Arthritis    Diabetes mellitus without complication (HCC)    Neuromuscular disorder (HCC)    BP (!) 162/97   Pulse 94   Ht 5\' 10"  (1.778 m)   Wt  201 lb (91.2 kg)   SpO2 97%   BMI 28.84 kg/m   Opioid Risk Score:   Fall Risk Score:  `1  Depression screen [facility] 2/9     11/04/2022    8:46 AM 09/04/2022    9:24 AM 07/07/2022    8:56 AM 03/13/2022    8:47 AM 01/12/2022    9:38 AM 09/29/2021    8:07 AM 09/10/2021    8:48 AM  Depression screen PHQ 2/9  Decreased Interest 0 1 0 0 0 1 0  Down, Depressed, Hopeless 0 1 0 0 0 1 0  PHQ - 2 Score 0 2 0 0 0 2 0    Review of Systems  Musculoskeletal:  Positive for back pain and neck pain.       Right shoulder pain, pain in both hands, pain in right buttock, pain in right arm      Objective:   Physical Exam Vitals and nursing note reviewed.  Constitutional:      Appearance: Normal appearance.  Neck:     Comments: Cervical  Paraspinal Tenderness: C-5-C-6  Cardiovascular:     Rate and Rhythm: Normal rate and regular rhythm.     Pulses: Normal pulses.     Heart sounds: Normal heart sounds.  Pulmonary:     Effort: Pulmonary effort is normal.     Breath sounds: Normal breath sounds.  Musculoskeletal:     Cervical back: Normal range of motion and neck supple.     Comments: Normal Muscle Bulk and Muscle Testing Reveals:  Upper Extremities: Full ROM and Muscle Strength 5/5 Bilateral AC Joint Tenderness Lumbar Paraspinal Tenderness: L-3-L-5 Lower Extremities: Full ROM and Muscle Strength 5/5 Arises from chair with ease Narrow Based Gait     Skin:    General: Skin is warm and dry.  Neurological:     Mental Status: He is alert and oriented to person, place, and time.  Psychiatric:        Mood and Affect: Mood normal.        Behavior: Behavior normal.         Assessment & Plan:  1.Cervicalgia/  Cervical Spondylosis with Radiculopathy: Continue HEP as Tolerated. Continue Lamictal. 11/04/2022. 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 Continue HEP as Tolerated .11/04/2022 3. Injury of right shoulder and right  upper arm. Continue HEP as Tolerated. Continue to Monitor. 11/04/2022. 4. Chronic Pain Syndrome:Continue: Refilled: Oxycodone 15 mg one tablet 4 times a day as needed for pain #120 .We will continue the opioid monitoring program, this consists of regular clinic visits, examinations, urine drug screen, pill counts as well as use of [place] Controlled Substance Reporting system. A 12 month History has been reviewed on the [place] Controlled Substance Reporting System on 11/04/2022. 5. Left Knee Pain: No Complaints Today. Continue HEP as Tolerated. Continue to Monitor. 11/04/2022 6. Neuropathic Pain: Continue Lamictal  He had NCS with Dr Wynn Banker in 12/22 Continue to Monitor. 11/04/2022   F/U in 2 months.

## 2022-11-04 NOTE — Telephone Encounter (Signed)
Urine drug screen for this encounter is consistent for prescribed medication 

## 2022-11-07 LAB — TOXASSURE SELECT,+ANTIDEPR,UR

## 2022-11-25 ENCOUNTER — Telehealth: Payer: Self-pay | Admitting: *Deleted

## 2022-11-25 NOTE — Telephone Encounter (Signed)
Urine drug screen for this encounter is consistent for prescribed medication 

## 2022-12-01 ENCOUNTER — Telehealth: Payer: Self-pay | Admitting: Registered Nurse

## 2022-12-01 ENCOUNTER — Other Ambulatory Visit: Payer: Self-pay | Admitting: Registered Nurse

## 2022-12-01 MED ORDER — OXYCODONE HCL 15 MG PO TABS: 15.0000 mg | ORAL_TABLET | Freq: Four times a day (QID) | ORAL | 0 refills | Status: DC | PRN

## 2022-12-01 NOTE — Telephone Encounter (Signed)
PMP was reviewed  Oxycodone e-scribed today  Mr. Marotte was called regarding the above and verbalizes understanding.

## 2022-12-01 NOTE — Telephone Encounter (Signed)
Patient needs a refill on oxycodone.

## 2022-12-21 ENCOUNTER — Other Ambulatory Visit: Payer: Self-pay | Admitting: Registered Nurse

## 2022-12-21 DIAGNOSIS — G5602 Carpal tunnel syndrome, left upper limb: Secondary | ICD-10-CM

## 2022-12-21 DIAGNOSIS — M4722 Other spondylosis with radiculopathy, cervical region: Secondary | ICD-10-CM

## 2023-01-01 ENCOUNTER — Encounter: Payer: Medicare PPO | Attending: Registered Nurse | Admitting: Registered Nurse

## 2023-01-01 ENCOUNTER — Encounter: Payer: Self-pay | Admitting: Registered Nurse

## 2023-01-01 VITALS — BP 143/83 | HR 75 | Ht 70.0 in | Wt 201.0 lb

## 2023-01-01 DIAGNOSIS — G894 Chronic pain syndrome: Secondary | ICD-10-CM | POA: Diagnosis present

## 2023-01-01 DIAGNOSIS — M542 Cervicalgia: Secondary | ICD-10-CM | POA: Insufficient documentation

## 2023-01-01 DIAGNOSIS — M5416 Radiculopathy, lumbar region: Secondary | ICD-10-CM | POA: Diagnosis present

## 2023-01-01 DIAGNOSIS — Z5181 Encounter for therapeutic drug level monitoring: Secondary | ICD-10-CM | POA: Diagnosis present

## 2023-01-01 DIAGNOSIS — M5412 Radiculopathy, cervical region: Secondary | ICD-10-CM | POA: Diagnosis present

## 2023-01-01 DIAGNOSIS — Z79891 Long term (current) use of opiate analgesic: Secondary | ICD-10-CM | POA: Insufficient documentation

## 2023-01-01 MED ORDER — OXYCODONE HCL 15 MG PO TABS: 15.0000 mg | ORAL_TABLET | Freq: Four times a day (QID) | ORAL | 0 refills | Status: DC | PRN

## 2023-01-01 NOTE — Progress Notes (Signed)
Subjective:    Patient ID: Erik Taylor, male    DOB: 1955/10/26, 67 y.o.   MRN: [id number]  HPI: Erik Taylor is a 67 y.o. male who returns for follow up appointment for chronic pain and medication refill. He states his pain is located in his neck radiating into his left shoulder and lower back pain radiating into his right buttock. He rates his pain 9. His current exercise regime is walking and performing stretching exercises.  Erik Taylor Morphine equivalent is 90.00 MME.   Last UDS was Performed on 11/04/2022, it was consistent.     Pain Inventory Average Pain 8 Pain Right Now 9 My pain is sharp, burning, dull, stabbing, tingling, and aching  In the last 24 hours, has pain interfered with the following? General activity 8 Relation with others 5 Enjoyment of life 6 What TIME of day is your pain at its worst? daytime Sleep (in general) Fair  Pain is worse with: walking, bending, sitting, inactivity, standing, and some activites Pain improves with: rest, heat/ice, and medication Relief from Meds: 7  Family History  Problem Relation Age of Onset   Stroke Mother    Heart disease Father    Heart disease Sister    Kidney disease Brother    Social History   Socioeconomic History   Marital status: Married    Spouse name: Not on file   Number of children: Not on file   Years of education: Not on file   Highest education level: Not on file  Occupational History   Not on file  Tobacco Use   Smoking status: Former    Types: Cigarettes    Quit date: 02/01/1994    Years since quitting: 28.9   Smokeless tobacco: Never  Vaping Use   Vaping Use: Never used  Substance and Sexual Activity   Alcohol use: Never   Drug use: Never   Sexual activity: Not on file  Other Topics Concern   Not on file  Social History Narrative   Not on file   Social Determinants of Health   Financial Resource Strain: Not on file  Food Insecurity: Not on file  Transportation Needs: Not on  file  Physical Activity: Not on file  Stress: Not on file  Social Connections: Not on file   Past Surgical History:  Procedure Laterality Date   arm nere repair     nerve repair   TONSILLECTOMY     Past Surgical History:  Procedure Laterality Date   arm nere repair     nerve repair   TONSILLECTOMY     Past Medical History:  Diagnosis Date   Arthritis    Diabetes mellitus without complication (HCC)    Neuromuscular disorder (HCC)    BP (!) 143/83   Pulse 75   Ht 5\' 10"  (1.778 m)   Wt 201 lb (91.2 kg)   SpO2 99%   BMI 28.84 kg/m   Opioid Risk Score:   Fall Risk Score:  `1  Depression screen [facility] 2/9     11/04/2022    8:46 AM 09/04/2022    9:24 AM 07/07/2022    8:56 AM 03/13/2022    8:47 AM 01/12/2022    9:38 AM 09/29/2021    8:07 AM 09/10/2021    8:48 AM  Depression screen PHQ 2/9  Decreased Interest 0 1 0 0 0 1 0  Down, Depressed, Hopeless 0 1 0 0 0 1 0  PHQ - 2 Score 0 2 0  0 0 2 0     Review of Systems  Musculoskeletal:  Positive for back pain.       Right arm pain   All other systems reviewed and are negative.      Objective:   Physical Exam Vitals and nursing note reviewed.  Constitutional:      Appearance: Normal appearance.  Neck:     Comments: Cervical Paraspinal Tenderness: C-5-C-6 Cardiovascular:     Rate and Rhythm: Normal rate and regular rhythm.     Pulses: Normal pulses.     Heart sounds: Normal heart sounds.  Pulmonary:     Effort: Pulmonary effort is normal.     Breath sounds: Normal breath sounds.  Musculoskeletal:     Cervical back: Normal range of motion and neck supple.     Comments: Normal Muscle Bulk and Muscle Testing Reveals:  Upper Extremities: Decreased ROM 90 Degrees and Muscle Strength 5/5 Bilateral AC Joint Tenderness Lumbar Hypersensitivity Lower Extremities: Full ROM and Muscle Strength 5/5 Arises from Table with ease Narrow Based  Gait     Skin:    General: Skin is warm and dry.  Neurological:     Mental Status: He  is alert and oriented to person, place, and time.  Psychiatric:        Mood and Affect: Mood normal.        Behavior: Behavior normal.         Assessment & Plan:  1.Cervicalgia/  Cervical Spondylosis with Radiculopathy: Continue HEP as Tolerated. Continue Lamictal. 01/01/2023. 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 Continue HEP as Tolerated .01/01/2023 3. Injury of right shoulder and right  upper arm. Continue HEP as Tolerated. Continue to Monitor. 01/01/2023. 4. Chronic Pain Syndrome:Continue: Refilled: Oxycodone 15 mg one tablet 4 times a day as needed for pain #120 Second script sent for the following month. We will continue the opioid monitoring program, this consists of regular clinic visits, examinations, urine drug screen, pill counts as well as use of [place] Controlled Substance Reporting system. A 12 month History has been reviewed on the [place] Controlled Substance Reporting System on 01/01/2023. 5. Left Knee Pain: No Complaints Today. Continue HEP as Tolerated. Continue to Monitor. 01/01/2023 6. Neuropathic Pain: Continue Lamictal  He had NCS with Dr Wynn Banker in 12/22 Continue to Monitor. 01/01/2023   F/U in 2 months.

## 2023-01-25 ENCOUNTER — Other Ambulatory Visit: Payer: Self-pay | Admitting: Registered Nurse

## 2023-01-25 DIAGNOSIS — G5602 Carpal tunnel syndrome, left upper limb: Secondary | ICD-10-CM

## 2023-01-25 DIAGNOSIS — M4722 Other spondylosis with radiculopathy, cervical region: Secondary | ICD-10-CM

## 2023-02-25 ENCOUNTER — Telehealth: Payer: Self-pay

## 2023-02-25 MED ORDER — OXYCODONE HCL 15 MG PO TABS: 15.0000 mg | ORAL_TABLET | Freq: Four times a day (QID) | ORAL | 0 refills | Status: DC | PRN

## 2023-02-25 NOTE — Telephone Encounter (Signed)
PMP was Reviewed.  Oxycodone e-scribed to pharmacy. Erik Taylor is aware via My-Chart message.

## 2023-02-25 NOTE — Telephone Encounter (Signed)
Patient called stating that Oxycodone 15 mg is at [facility] in [place]. He states his appt is 03/03/23 will be out medication on Sunday 02/28/23.

## 2023-03-03 ENCOUNTER — Encounter: Payer: Medicare PPO | Admitting: Registered Nurse

## 2023-03-25 ENCOUNTER — Other Ambulatory Visit (HOSPITAL_COMMUNITY): Payer: Self-pay

## 2023-03-25 ENCOUNTER — Telehealth: Payer: Self-pay | Admitting: *Deleted

## 2023-03-25 MED ORDER — OXYCODONE HCL 15 MG PO TABS
15.0000 mg | ORAL_TABLET | Freq: Four times a day (QID) | ORAL | 0 refills | Status: DC | PRN
  Filled 2023-03-25: qty 120, 30d supply, fill #0

## 2023-03-25 NOTE — Telephone Encounter (Signed)
PMP was Reviewed.  Oxycodone e- scribed today  [name] was called regarding the above, he verbalizes understanding. He has a scheduled appointment on Monday.

## 2023-03-25 NOTE — Telephone Encounter (Signed)
Pt requesting refill on pain medication He says it was sent to [facility] last time but they are out. He would like it sent to [facility]. Patient has appt on Monday.

## 2023-03-25 NOTE — Addendum Note (Signed)
Addended by: Jones Bales on: 03/25/2023 01:57 PM   Modules accepted: Orders

## 2023-03-29 ENCOUNTER — Encounter: Payer: Medicare PPO | Attending: Registered Nurse | Admitting: Registered Nurse

## 2023-03-29 ENCOUNTER — Encounter: Payer: Self-pay | Admitting: Registered Nurse

## 2023-03-29 VITALS — BP 131/69 | HR 77 | Ht 70.0 in | Wt 202.0 lb

## 2023-03-29 DIAGNOSIS — Z5181 Encounter for therapeutic drug level monitoring: Secondary | ICD-10-CM | POA: Diagnosis present

## 2023-03-29 DIAGNOSIS — M792 Neuralgia and neuritis, unspecified: Secondary | ICD-10-CM | POA: Diagnosis present

## 2023-03-29 DIAGNOSIS — M5412 Radiculopathy, cervical region: Secondary | ICD-10-CM | POA: Diagnosis present

## 2023-03-29 DIAGNOSIS — M5416 Radiculopathy, lumbar region: Secondary | ICD-10-CM | POA: Diagnosis present

## 2023-03-29 DIAGNOSIS — M542 Cervicalgia: Secondary | ICD-10-CM | POA: Insufficient documentation

## 2023-03-29 DIAGNOSIS — G894 Chronic pain syndrome: Secondary | ICD-10-CM | POA: Diagnosis not present

## 2023-03-29 DIAGNOSIS — Z79891 Long term (current) use of opiate analgesic: Secondary | ICD-10-CM | POA: Insufficient documentation

## 2023-03-29 MED ORDER — OXYCODONE HCL 15 MG PO TABS: 15.0000 mg | ORAL_TABLET | Freq: Four times a day (QID) | ORAL | 0 refills | Status: DC | PRN

## 2023-03-29 NOTE — Progress Notes (Signed)
Subjective:    Patient ID: Erik Taylor, male    DOB: 07-Apr-1956, 67 y.o.   MRN: [id number]  HPI: Erik Taylor is a 67 y.o. male who returns for follow up appointment for chronic pain and medication refill. He states his pain is located in his neck radiating into his right shoulder, lower back pain radiating into his right buttock. He also reports bilateral hand with nerve pain. He rates his pain 9. His current exercise regime is walking and performing stretching exercises.  Erik Taylor Morphine equivalent is 90.00 MME.   UDS ordered today.    Pain Inventory Average Pain 8 Pain Right Now 9 My pain is sharp, burning, dull, stabbing, tingling, and aching  In the last 24 hours, has pain interfered with the following? General activity 7 Relation with others 5 Enjoyment of life 6 What TIME of day is your pain at its worst? daytime Sleep (in general) Poor  Pain is worse with: walking, bending, sitting, inactivity, standing, and some activites Pain improves with: rest, heat/ice, and medication Relief from Meds: 6  Family History  Problem Relation Age of Onset   Stroke Mother    Heart disease Father    Heart disease Sister    Kidney disease Brother    Social History   Socioeconomic History   Marital status: Married    Spouse name: Not on file   Number of children: Not on file   Years of education: Not on file   Highest education level: Not on file  Occupational History   Not on file  Tobacco Use   Smoking status: Former    Current packs/day: 0.00    Types: Cigarettes    Quit date: 02/01/1994    Years since quitting: 29.1   Smokeless tobacco: Never  Vaping Use   Vaping status: Never Used  Substance and Sexual Activity   Alcohol use: Never   Drug use: Never   Sexual activity: Not on file  Other Topics Concern   Not on file  Social History Narrative   Not on file   Social Determinants of Health   Financial Resource Strain: Not on file  Food Insecurity: Not  on file  Transportation Needs: Not on file  Physical Activity: Not on file  Stress: Not on file  Social Connections: Not on file   Past Surgical History:  Procedure Laterality Date   arm nere repair     nerve repair   TONSILLECTOMY     Past Surgical History:  Procedure Laterality Date   arm nere repair     nerve repair   TONSILLECTOMY     Past Medical History:  Diagnosis Date   Arthritis    Diabetes mellitus without complication (HCC)    Neuromuscular disorder (HCC)    BP 131/69   Pulse 77   Ht 5\' 10"  (1.778 m)   Wt 202 lb (91.6 kg)   SpO2 98%   BMI 28.98 kg/m   Opioid Risk Score:   Fall Risk Score:  `1  Depression screen [facility] 2/9     03/29/2023    9:08 AM 11/04/2022    8:46 AM 09/04/2022    9:24 AM 07/07/2022    8:56 AM 03/13/2022    8:47 AM 01/12/2022    9:38 AM 09/29/2021    8:07 AM  Depression screen PHQ 2/9  Decreased Interest 0 0 1 0 0 0 1  Down, Depressed, Hopeless 0 0 1 0 0 0 1  PHQ -  2 Score 0 0 2 0 0 0 2      Review of Systems  Musculoskeletal:  Positive for back pain.       Right arm pain B/L hand pain   All other systems reviewed and are negative.     Objective:   Physical Exam Vitals and nursing note reviewed.  Constitutional:      Appearance: Normal appearance.  Neck:     Comments: Cervical Paraspinal Tenderness: C-5-C-6 Cardiovascular:     Rate and Rhythm: Normal rate and regular rhythm.     Pulses: Normal pulses.     Heart sounds: Normal heart sounds.  Pulmonary:     Effort: Pulmonary effort is normal.     Breath sounds: Normal breath sounds.  Musculoskeletal:     Cervical back: Normal range of motion and neck supple.     Comments: Normal Muscle Bulk and Muscle Testing Reveals:  Upper Extremities: Right : Decreased ROM 90 Degrees and Muscle Strength 5/5 Left Upper Extremity: Full ROM and Muscle Strength 5/5  Thoracic Paraspinal Tenderness: T-1-T-2 Lumbar Paraspinal Tenderness: L-4-L-5 Right Greater Trochanter Tenderness Lower  Extremities: Full ROM and Muscle Strength 5/5 Arises from Chair slowly Narrow Based  Gait     Skin:    General: Skin is warm and dry.  Neurological:     Mental Status: He is alert and oriented to person, place, and time.  Psychiatric:        Mood and Affect: Mood normal.        Behavior: Behavior normal.         Assessment & Plan:  1.Cervicalgia/  Cervical Spondylosis with Radiculopathy: Continue HEP as Tolerated. Continue Lamictal. 03/29/2023. 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 Continue HEP as Tolerated .03/29/2023 3. Injury of right shoulder and right  upper arm. Continue HEP as Tolerated. Continue to Monitor. 03/29/2023. 4. Chronic Pain Syndrome:Continue: Refilled: Oxycodone 15 mg one tablet 4 times a day as needed for pain #120 Second script sent for the following month. We will continue the opioid monitoring program, this consists of regular clinic visits, examinations, urine drug screen, pill counts as well as use of [place] Controlled Substance Reporting system. A 12 month History has been reviewed on the [place] Controlled Substance Reporting System on 03/29/2023. 5. Left Knee Pain: No Complaints Today. Continue HEP as Tolerated. Continue to Monitor. 03/29/2023 6. Bilateral Hand with Neuropathic Pain: Continue Lamictal  He had NCS with Dr Wynn Banker in 12/22 Continue to Monitor. 03/29/2023   F/U in 2 months.

## 2023-03-31 LAB — TOXASSURE SELECT,+ANTIDEPR,UR

## 2023-04-11 ENCOUNTER — Other Ambulatory Visit: Payer: Self-pay | Admitting: Registered Nurse

## 2023-04-11 DIAGNOSIS — G5602 Carpal tunnel syndrome, left upper limb: Secondary | ICD-10-CM

## 2023-04-11 DIAGNOSIS — M4722 Other spondylosis with radiculopathy, cervical region: Secondary | ICD-10-CM

## 2023-04-15 ENCOUNTER — Telehealth: Payer: Self-pay | Admitting: Registered Nurse

## 2023-04-15 DIAGNOSIS — M4722 Other spondylosis with radiculopathy, cervical region: Secondary | ICD-10-CM

## 2023-04-15 DIAGNOSIS — G5602 Carpal tunnel syndrome, left upper limb: Secondary | ICD-10-CM

## 2023-04-15 MED ORDER — LAMOTRIGINE 25 MG PO TABS: 50.0000 mg | ORAL_TABLET | Freq: Two times a day (BID) | ORAL | 1 refills | Status: DC

## 2023-04-15 NOTE — Telephone Encounter (Signed)
Lamictal prescription sent to pharmacy. Erik Taylor is aware of the above via My-Chart message.

## 2023-05-20 NOTE — Progress Notes (Signed)
Subjective:    Patient ID: Erik Taylor, male    DOB: [birth year], 67 y.o.   MRN: [id number]  HPI: Erik Taylor is a 67 y.o. male who returns for follow up appointment for chronic pain and medication refill. He states his pain is located in his neck radiating into his right shoulder and lower back pain radiating into his right hip pain. He rates his pain 9. His current exercise regime is walking and performing stretching exercises.  Erik Taylor Morphine equivalent is 90.00 MME.   Last UDS was Performed on 03/29/2023, it was consistent.    Pain Inventory Average Pain 8 Pain Right Now 9 My pain is sharp, burning, dull, stabbing, tingling, and aching  In the last 24 hours, has pain interfered with the following? General activity 7 Relation with others 5 Enjoyment of life 6 What TIME of day is your pain at its worst? daytime Sleep (in general) Poor  Pain is worse with: walking, bending, sitting, inactivity, standing, and some activites Pain improves with: rest, heat/ice, therapy/exercise, pacing activities, medication, TENS, and injections Relief from Meds: 6  Family History  Problem Relation Age of Onset   Stroke Mother    Heart disease Father    Heart disease Sister    Kidney disease Brother    Social History   Socioeconomic History   Marital status: Married    Spouse name: Not on file   Number of children: Not on file   Years of education: Not on file   Highest education level: Not on file  Occupational History   Not on file  Tobacco Use   Smoking status: Former    Current packs/day: 0.00    Types: Cigarettes    Quit date: 02/01/1994    Years since quitting: 29.3   Smokeless tobacco: Never  Vaping Use   Vaping status: Never Used  Substance and Sexual Activity   Alcohol use: Never   Drug use: Never   Sexual activity: Not on file  Other Topics Concern   Not on file  Social History Narrative   Not on file   Social Determinants of Health   Financial  Resource Strain: Not on file  Food Insecurity: Not on file  Transportation Needs: Not on file  Physical Activity: Not on file  Stress: Not on file  Social Connections: Not on file   Past Surgical History:  Procedure Laterality Date   arm nere repair     nerve repair   TONSILLECTOMY     Past Surgical History:  Procedure Laterality Date   arm nere repair     nerve repair   TONSILLECTOMY     Past Medical History:  Diagnosis Date   Arthritis    Diabetes mellitus without complication (HCC)    Neuromuscular disorder (HCC)    There were no vitals taken for this visit.  Opioid Risk Score:   Fall Risk Score:  `1  Depression screen [facility] 2/9     03/29/2023    9:08 AM 11/04/2022    8:46 AM 09/04/2022    9:24 AM 07/07/2022    8:56 AM 03/13/2022    8:47 AM 01/12/2022    9:38 AM 09/29/2021    8:07 AM  Depression screen PHQ 2/9  Decreased Interest 0 0 1 0 0 0 1  Down, Depressed, Hopeless 0 0 1 0 0 0 1  PHQ - 2 Score 0 0 2 0 0 0 2    Review of Systems  Constitutional:  Negative.   HENT: Negative.    Eyes: Negative.   Respiratory: Negative.    Cardiovascular: Negative.   Gastrointestinal: Negative.   Endocrine: Negative.   Genitourinary: Negative.   Musculoskeletal:  Positive for arthralgias, back pain and neck pain.       Right arm  Skin: Negative.   Allergic/Immunologic: Negative.   Neurological: Negative.   Hematological: Negative.   Psychiatric/Behavioral: Negative.    All other systems reviewed and are negative.      Objective:   Physical Exam Vitals and nursing note reviewed.  Neck:     Comments: Cervical Paraspinal Tenderness: C- 4-C-6 Decreased Cervical Extension and Left and Right Rotation  Cardiovascular:     Rate and Rhythm: Normal rate and regular rhythm.     Pulses: Normal pulses.     Heart sounds: Normal heart sounds.  Pulmonary:     Effort: Pulmonary effort is normal.     Breath sounds: Normal breath sounds.  Musculoskeletal:     Comments: Normal  Muscle Bulk and Muscle Testing Reveals:  Upper Extremities: Right: Decreased ROM 45 Degrees and Muscle Strength 5/5 Right AC Joint Tenderness Left Upper Extremity: Full ROM and Muscle Strength 5/5  Lumbar Paraspinal Tenderness: L-3-L-5 Lower Extremities: Full ROM and Muscle Strength 5/5 Arises from Chair with ease Narrow Based  Gait     Skin:    General: Skin is warm and dry.  Neurological:     Mental Status: He is oriented to person, place, and time.  Psychiatric:        Mood and Affect: Mood normal.        Behavior: Behavior normal.         Assessment & Plan:  1.Cervicalgia/  Cervical Spondylosis with Radiculopathy: Continue HEP as Tolerated. Continue Lamictal. 05/21/2023. 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 Continue HEP as Tolerated .05/21/2023 3. Injury of right shoulder and right  upper arm. Continue HEP as Tolerated. Continue to Monitor. 05/21/2023. 4. Chronic Pain Syndrome:Continue: Refilled: Oxycodone 15 mg one tablet 4 times a day as needed for pain #120 Second script sent for the following month. We will continue the opioid monitoring program, this consists of regular clinic visits, examinations, urine drug screen, pill counts as well as use of [place] Controlled Substance Reporting system. A 12 month History has been reviewed on the [place] Controlled Substance Reporting System on 05/21/2023. 5. Left Knee Pain: No Complaints Today. Continue HEP as Tolerated. Continue to Monitor. 05/21/2023 6. Bilateral Hand with Neuropathic Pain: Continue Lamictal  He had NCS with Dr Wynn Banker in 12/22 Continue to Monitor. 05/21/2023   F/U in 2 months.

## 2023-05-21 ENCOUNTER — Encounter: Payer: Medicare PPO | Attending: Registered Nurse | Admitting: Registered Nurse

## 2023-05-21 ENCOUNTER — Encounter: Payer: Medicare PPO | Admitting: Registered Nurse

## 2023-05-21 ENCOUNTER — Encounter: Payer: Self-pay | Admitting: Registered Nurse

## 2023-05-21 VITALS — BP 159/93 | HR 73 | Ht 70.0 in | Wt 203.6 lb

## 2023-05-21 DIAGNOSIS — M5412 Radiculopathy, cervical region: Secondary | ICD-10-CM | POA: Insufficient documentation

## 2023-05-21 DIAGNOSIS — Z79891 Long term (current) use of opiate analgesic: Secondary | ICD-10-CM | POA: Diagnosis present

## 2023-05-21 DIAGNOSIS — G894 Chronic pain syndrome: Secondary | ICD-10-CM | POA: Insufficient documentation

## 2023-05-21 DIAGNOSIS — Z5181 Encounter for therapeutic drug level monitoring: Secondary | ICD-10-CM | POA: Diagnosis present

## 2023-05-21 DIAGNOSIS — M5416 Radiculopathy, lumbar region: Secondary | ICD-10-CM | POA: Insufficient documentation

## 2023-05-21 DIAGNOSIS — M542 Cervicalgia: Secondary | ICD-10-CM | POA: Diagnosis not present

## 2023-05-21 MED ORDER — OXYCODONE HCL 15 MG PO TABS: 15.0000 mg | ORAL_TABLET | Freq: Four times a day (QID) | ORAL | 0 refills | Status: DC | PRN

## 2023-05-24 NOTE — Telephone Encounter (Signed)
error 

## 2023-06-19 ENCOUNTER — Other Ambulatory Visit: Payer: Self-pay | Admitting: Registered Nurse

## 2023-06-19 DIAGNOSIS — G5602 Carpal tunnel syndrome, left upper limb: Secondary | ICD-10-CM

## 2023-06-19 DIAGNOSIS — M4722 Other spondylosis with radiculopathy, cervical region: Secondary | ICD-10-CM

## 2023-06-21 ENCOUNTER — Other Ambulatory Visit: Payer: Self-pay | Admitting: Registered Nurse

## 2023-07-16 ENCOUNTER — Encounter: Payer: Self-pay | Admitting: Registered Nurse

## 2023-07-16 ENCOUNTER — Encounter: Payer: Medicare HMO | Attending: Registered Nurse | Admitting: Registered Nurse

## 2023-07-16 VITALS — BP 136/75 | HR 74 | Ht 70.0 in

## 2023-07-16 DIAGNOSIS — Z79891 Long term (current) use of opiate analgesic: Secondary | ICD-10-CM | POA: Insufficient documentation

## 2023-07-16 DIAGNOSIS — Z5181 Encounter for therapeutic drug level monitoring: Secondary | ICD-10-CM | POA: Diagnosis not present

## 2023-07-16 DIAGNOSIS — G894 Chronic pain syndrome: Secondary | ICD-10-CM | POA: Diagnosis not present

## 2023-07-16 MED ORDER — OXYCODONE HCL 15 MG PO TABS: 15.0000 mg | ORAL_TABLET | Freq: Four times a day (QID) | ORAL | 0 refills | Status: DC | PRN

## 2023-07-16 NOTE — Progress Notes (Signed)
 Subjective:    Patient ID: Erik Taylor, male    DOB: 1955-08-30, 68 y.o.   MRN: [id number]  HPI: Erik Taylor is a 68 y.o. male who returns for follow up appointment for chronic pain and medication refill. He states his pain is located in his neck radiating into his right shoulder and lower back pain. He rates his pain 8. His current exercise regime is walking and performing stretching exercises.  [name] Morphine equivalent is 90.00 MME.  UDS ordered Today.     Pain Inventory Average Pain 7 Pain Right Now 8 My pain is sharp, burning, dull, stabbing, tingling, and aching  In the last 24 hours, has pain interfered with the following? General activity 7 Relation with others 5 Enjoyment of life 6 What TIME of day is your pain at its worst? daytime Sleep (in general) Poor  Pain is worse with: walking, bending, sitting, inactivity, standing, and some activites Pain improves with: rest, heat/ice, and medication Relief from Meds: 6  Family History  Problem Relation Age of Onset   Stroke Mother    Heart disease Father    Heart disease Sister    Kidney disease Brother    Social History   Socioeconomic History   Marital status: Married    Spouse name: Not on file   Number of children: Not on file   Years of education: Not on file   Highest education level: Not on file  Occupational History   Not on file  Tobacco Use   Smoking status: Former    Current packs/day: 0.00    Types: Cigarettes    Quit date: 02/01/1994    Years since quitting: 29.4   Smokeless tobacco: Never  Vaping Use   Vaping status: Never Used  Substance and Sexual Activity   Alcohol use: Never   Drug use: Never   Sexual activity: Not on file  Other Topics Concern   Not on file  Social History Narrative   Not on file   Social Drivers of Health   Financial Resource Strain: Not on file  Food Insecurity: Not on file  Transportation Needs: Not on file  Physical Activity: Not on file   Stress: Not on file  Social Connections: Not on file   Past Surgical History:  Procedure Laterality Date   arm nere repair     nerve repair   TONSILLECTOMY     Past Surgical History:  Procedure Laterality Date   arm nere repair     nerve repair   TONSILLECTOMY     Past Medical History:  Diagnosis Date   Arthritis    Diabetes mellitus without complication (HCC)    Neuromuscular disorder (HCC)    BP 136/75   Pulse 74   Ht 5' 10 (1.778 m)   SpO2 97%   BMI 29.21 kg/m   Opioid Risk Score:   Fall Risk Score:  `1  Depression screen [facility] 2/9     07/16/2023    8:32 AM 05/21/2023    9:55 AM 03/29/2023    9:08 AM 11/04/2022    8:46 AM 09/04/2022    9:24 AM 07/07/2022    8:56 AM 03/13/2022    8:47 AM  Depression screen PHQ 2/9  Decreased Interest 0 0 0 0 1 0 0  Down, Depressed, Hopeless 0 0 0 0 1 0 0  PHQ - 2 Score 0 0 0 0 2 0 0     Review of Systems  Musculoskeletal:  Positive for back pain and neck pain.  All other systems reviewed and are negative.      Objective:   Physical Exam Vitals and nursing note reviewed.  Constitutional:      Appearance: Normal appearance.  Neck:     Comments: Cervical Paraspinal Tenderness: C-5-C-6 Cardiovascular:     Rate and Rhythm: Normal rate and regular rhythm.     Pulses: Normal pulses.     Heart sounds: Normal heart sounds.  Pulmonary:     Effort: Pulmonary effort is normal.     Breath sounds: Normal breath sounds.  Musculoskeletal:     Comments: Normal Muscle Bulk and Muscle Testing Reveals:  Upper Extremities:Right: Decreased  ROM 90 Degrees  and Muscle Strength 5/5 Left Upper Extremity: Full ROM and Muscle Strength 5/5  Lumbar Paraspinal Tenderness: L-4-L-5 Lower Extremities:  Full ROM and Muscle Strength 5/5 Arises from Table with ease Narrow based Gait     Skin:    General: Skin is warm and dry.  Neurological:     Mental Status: He is alert and oriented to person, place, and time.  Psychiatric:        Mood and  Affect: Mood normal.        Behavior: Behavior normal.         Assessment & Plan:  1.Cervicalgia/  Cervical Spondylosis with Radiculopathy: Continue HEP as Tolerated. Continue Lamictal . 07/16/2023. 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 Continue HEP as Tolerated .07/16/2023 3. Injury of right shoulder and right  upper arm. Continue HEP as Tolerated. Continue to Monitor. 07/16/2023. 4. Chronic Pain Syndrome:Continue: Refilled: Oxycodone  15 mg one tablet 4 times a day as needed for pain #120 Second script sent for the following month. We will continue the opioid monitoring program, this consists of regular clinic visits, examinations, urine drug screen, pill counts as well as use of [place]  Controlled Substance Reporting system. A 12 month History has been reviewed on the [place]  Controlled Substance Reporting System on 07/16/2023. 5. Left Knee Pain: No Complaints Today. Continue HEP as Tolerated. Continue to Monitor. 07/16/2023 6. Bilateral Hand with Neuropathic Pain: Continue Lamictal   He had NCS with Dr Carilyn in 12/22 Continue to Monitor. 07/15/2022   F/U in 2 months.

## 2023-07-18 LAB — TOXASSURE SELECT,+ANTIDEPR,UR

## 2023-07-26 ENCOUNTER — Other Ambulatory Visit: Payer: Self-pay | Admitting: Registered Nurse

## 2023-07-26 DIAGNOSIS — M4722 Other spondylosis with radiculopathy, cervical region: Secondary | ICD-10-CM

## 2023-07-26 DIAGNOSIS — G5602 Carpal tunnel syndrome, left upper limb: Secondary | ICD-10-CM

## 2023-08-13 ENCOUNTER — Ambulatory Visit: Payer: Medicare HMO | Admitting: Registered Nurse

## 2023-08-31 ENCOUNTER — Other Ambulatory Visit: Payer: Self-pay | Admitting: Registered Nurse

## 2023-08-31 DIAGNOSIS — M4722 Other spondylosis with radiculopathy, cervical region: Secondary | ICD-10-CM

## 2023-08-31 DIAGNOSIS — G5602 Carpal tunnel syndrome, left upper limb: Secondary | ICD-10-CM

## 2023-09-10 ENCOUNTER — Telehealth (INDEPENDENT_AMBULATORY_CARE_PROVIDER_SITE_OTHER): Payer: Self-pay

## 2023-09-10 ENCOUNTER — Encounter: Payer: Self-pay | Admitting: Registered Nurse

## 2023-09-10 ENCOUNTER — Encounter: Payer: Medicare HMO | Attending: Registered Nurse | Admitting: Registered Nurse

## 2023-09-10 ENCOUNTER — Telehealth: Payer: Self-pay | Admitting: Registered Nurse

## 2023-09-10 VITALS — BP 152/90 | HR 74 | Ht 70.0 in | Wt 193.0 lb

## 2023-09-10 DIAGNOSIS — M542 Cervicalgia: Secondary | ICD-10-CM | POA: Diagnosis present

## 2023-09-10 DIAGNOSIS — G894 Chronic pain syndrome: Secondary | ICD-10-CM | POA: Diagnosis not present

## 2023-09-10 DIAGNOSIS — Z5181 Encounter for therapeutic drug level monitoring: Secondary | ICD-10-CM | POA: Insufficient documentation

## 2023-09-10 DIAGNOSIS — Z79891 Long term (current) use of opiate analgesic: Secondary | ICD-10-CM | POA: Insufficient documentation

## 2023-09-10 DIAGNOSIS — M5412 Radiculopathy, cervical region: Secondary | ICD-10-CM | POA: Diagnosis present

## 2023-09-10 DIAGNOSIS — M5416 Radiculopathy, lumbar region: Secondary | ICD-10-CM | POA: Diagnosis present

## 2023-09-10 MED ORDER — OXYCODONE HCL 15 MG PO TABS: 15.0000 mg | ORAL_TABLET | Freq: Four times a day (QID) | ORAL | 0 refills | Status: DC | PRN

## 2023-09-10 NOTE — Telephone Encounter (Signed)
 Pt called and stated he is waiting at his pharmacy for his medicine. He stated they have called and just needs eunice to ok the medicine

## 2023-09-10 NOTE — Telephone Encounter (Signed)
 Katie from Pharmacy called and said paitent is trying to get a refill but he is early and is going on out of town and wont be around to pick up new medication, asking for a call back to give them the okay.   [phone]

## 2023-09-10 NOTE — Progress Notes (Signed)
 Subjective:    Patient ID: Erik Taylor, male    DOB: 1956/05/22, 68 y.o.   MRN: [id number]  HPI: Erik Taylor is a 68 y.o. male who returns for follow up appointment for chronic pain and medication refill. He states his pain is located in his neck radiating into his right shoulder and lower back pain radiating into his right buttock occasionally. He rates his pain 7. His current exercise regime is walking and performing stretching exercises.  Erik Taylor Morphine equivalent is 90.00 MME.   Last UDS was Performed on 07/16/2023, it was consistent.     Pain Inventory Average Pain 8 Pain Right Now 7 My pain is sharp, burning, dull, stabbing, tingling, and aching  In the last 24 hours, has pain interfered with the following? General activity 7 Relation with others 5 Enjoyment of life 6 What TIME of day is your pain at its worst? daytime Sleep (in general) Poor  Pain is worse with: walking, bending, sitting, inactivity, standing, and some activites Pain improves with: rest, heat/ice, and medication Relief from Meds: 6  Family History  Problem Relation Age of Onset   Stroke Mother    Heart disease Father    Heart disease Sister    Kidney disease Brother    Social History   Socioeconomic History   Marital status: Married    Spouse name: Not on file   Number of children: Not on file   Years of education: Not on file   Highest education level: Not on file  Occupational History   Not on file  Tobacco Use   Smoking status: Former    Current packs/day: 0.00    Types: Cigarettes    Quit date: 02/01/1994    Years since quitting: 29.6   Smokeless tobacco: Never  Vaping Use   Vaping status: Never Used  Substance and Sexual Activity   Alcohol use: Never   Drug use: Never   Sexual activity: Not on file  Other Topics Concern   Not on file  Social History Narrative   Not on file   Social Drivers of Health   Financial Resource Strain: Not on file  Food Insecurity:  Not on file  Transportation Needs: Not on file  Physical Activity: Not on file  Stress: Not on file  Social Connections: Not on file   Past Surgical History:  Procedure Laterality Date   arm nere repair     nerve repair   TONSILLECTOMY     Past Surgical History:  Procedure Laterality Date   arm nere repair     nerve repair   TONSILLECTOMY     Past Medical History:  Diagnosis Date   Arthritis    Diabetes mellitus without complication (HCC)    Neuromuscular disorder (HCC)    There were no vitals taken for this visit.  Opioid Risk Score:   Fall Risk Score:  `1  Depression screen Erik Taylor 2/9     07/16/2023    8:32 AM 05/21/2023    9:55 AM 03/29/2023    9:08 AM 11/04/2022    8:46 AM 09/04/2022    9:24 AM 07/07/2022    8:56 AM 03/13/2022    8:47 AM  Depression screen PHQ 2/9  Decreased Interest 0 0 0 0 1 0 0  Down, Depressed, Hopeless 0 0 0 0 1 0 0  PHQ - 2 Score 0 0 0 0 2 0 0     Review of Systems  Musculoskeletal:  Positive for  back pain.       Buttock pain Right shoulder pain Right arm pain Bilateral hand pain  All other systems reviewed and are negative.     Objective:   Physical Exam Vitals and nursing note reviewed.  Constitutional:      Appearance: Normal appearance.  Neck:     Comments: Cervical Paraspinal Tenderness: C-5-C-6  Cardiovascular:     Rate and Rhythm: Normal rate and regular rhythm.     Pulses: Normal pulses.     Heart sounds: Normal heart sounds.  Pulmonary:     Effort: Pulmonary effort is normal.     Breath sounds: Normal breath sounds.  Musculoskeletal:     Comments: Normal Muscle Bulk and Muscle Testing Reveals:  Upper Extremities: Right: Decreased ROM 90 Degrees and Muscle Strength 5/5 Left Upper extremity: Full ROM and Muscle Strength 5/5 Lumbar Hypersensitivity Lower Extremities: Full ROM and Muscle Strength 5/5 Arises from chair with ease Narrow Based  Gait     Skin:    General: Skin is warm and dry.  Neurological:     Mental  Status: He is alert and oriented to person, place, and time.  Psychiatric:        Mood and Affect: Mood normal.        Behavior: Behavior normal.         Assessment & Plan:  1.Cervicalgia/  Cervical Spondylosis with Radiculopathy: Continue HEP as Tolerated. Continue Lamictal. 09/10/2023. 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 Continue HEP as Tolerated .09/10/2023 3. Injury of right shoulder and right  upper arm. Continue HEP as Tolerated. Continue to Monitor. 09/10/2023. 4. Chronic Pain Syndrome:Continue: Refilled: Oxycodone 15 mg one tablet 4 times a day as needed for pain #120 Second script sent for the following month. We will continue the opioid monitoring program, this consists of regular clinic visits, examinations, urine drug screen, pill counts as well as use of [place] Controlled Substance Reporting system. A 12 month History has been reviewed on the [place] Controlled Substance Reporting System on 09/10/2023. 5. Left Knee Pain: No Complaints Today. Continue HEP as Tolerated. Continue to Monitor. 09/10/2023 6. Bilateral Hand with Neuropathic Pain: Continue Lamictal  He had NCS with Dr Wynn Banker in 12/22 Continue to Monitor. 09/10/2022   F/U in 2 months.

## 2023-09-10 NOTE — Telephone Encounter (Signed)
 Return [facility] Call,  Okay to fill Oxycodone eraly due to Erik Taylor going to be out of town.  Call placed to Erik Taylor, he verbalizes understanding.

## 2023-11-04 NOTE — Progress Notes (Signed)
 Subjective:    Patient ID: Erik Taylor, male    DOB: 1956/03/15, 68 y.o.   MRN: [id number]  HPI: Erik Taylor is a 68 y.o. male who returns for follow up appointment for chronic pain and medication refill. He  states his pain is located in his  neck radiating into his right shoulder and lower back pain radiating into his right buttock. He rates his pain 8. His current exercise regime is walking and performing stretching exercises.  Mr. Koehnen Morphine equivalent is 90.00 MME.   Last UDS was Performed on 07/16/2023, it was consistent.      Pain Inventory Average Pain 8 Pain Right Now 8 My pain is sharp, burning, dull, stabbing, tingling, and aching  In the last 24 hours, has pain interfered with the following? General activity 7 Relation with others 5 Enjoyment of life 6 What TIME of day is your pain at its worst? daytime Sleep (in general) Poor  Pain is worse with: walking, bending, sitting, inactivity, standing, and some activites Pain improves with: rest, heat/ice, and medication Relief from Meds: 6  Family History  Problem Relation Age of Onset   Stroke Mother    Heart disease Father    Heart disease Sister    Kidney disease Brother    Social History   Socioeconomic History   Marital status: Married    Spouse name: Not on file   Number of children: Not on file   Years of education: Not on file   Highest education level: Not on file  Occupational History   Not on file  Tobacco Use   Smoking status: Former    Current packs/day: 0.00    Types: Cigarettes    Quit date: 02/01/1994    Years since quitting: 29.7   Smokeless tobacco: Never  Vaping Use   Vaping status: Never Used  Substance and Sexual Activity   Alcohol use: Never   Drug use: Never   Sexual activity: Not on file  Other Topics Concern   Not on file  Social History Narrative   Not on file   Social Drivers of Health   Financial Resource Strain: Not on file  Food Insecurity: Not on file   Transportation Needs: Not on file  Physical Activity: Not on file  Stress: Not on file  Social Connections: Not on file   Past Surgical History:  Procedure Laterality Date   arm nere repair     nerve repair   TONSILLECTOMY     Past Surgical History:  Procedure Laterality Date   arm nere repair     nerve repair   TONSILLECTOMY     Past Medical History:  Diagnosis Date   Arthritis    Diabetes mellitus without complication (HCC)    Neuromuscular disorder (HCC)    BP (!) 156/95 (BP Location: Right Arm, Patient Position: Sitting, Cuff Size: Normal)   Pulse 75   Resp 16   Ht 5\' 10"  (1.778 m)   Wt 204 lb 12.8 oz (92.9 kg)   SpO2 98%   BMI 29.39 kg/m   Opioid Risk Score:   Fall Risk Score:  `1  Depression screen [facility] 2/9     07/16/2023    8:32 AM 05/21/2023    9:55 AM 03/29/2023    9:08 AM 11/04/2022    8:46 AM 09/04/2022    9:24 AM 07/07/2022    8:56 AM 03/13/2022    8:47 AM  Depression screen PHQ 2/9  Decreased Interest 0  0 0 0 1 0 0  Down, Depressed, Hopeless 0 0 0 0 1 0 0  PHQ - 2 Score 0 0 0 0 2 0 0    Review of Systems  Musculoskeletal:        Right arm  Lower back        Objective:   Physical Exam Vitals and nursing note reviewed.  Constitutional:      Appearance: Normal appearance.  Cardiovascular:     Rate and Rhythm: Normal rate and regular rhythm.     Pulses: Normal pulses.     Heart sounds: Normal heart sounds.  Pulmonary:     Effort: Pulmonary effort is normal.     Breath sounds: Normal breath sounds.  Musculoskeletal:     Comments: Normal Muscle Bulk and Muscle Testing Reveals:  Upper Extremities: Right: Decreased ROM 90 Degrees and Muscle Strength 4/5 Left Upper Extremity: Full ROM and Muscle Strength 5/5 Bilateral AC Joint Tenderness: R>L Lumbar Paraspinal Tenderness:L L-4-L-5 Lower Extremities: Full ROM and Muscle Strength 5/5 Arises from Chair slowly Narrow Based   Gait     Skin:    General: Skin is warm and dry.  Neurological:      Mental Status: He is alert and oriented to person, place, and time.  Psychiatric:        Mood and Affect: Mood normal.        Behavior: Behavior normal.          Assessment & Plan:  1.Cervicalgia/  Cervical Spondylosis with Radiculopathy: Continue HEP as Tolerated. Continue Lamictal . 11/05/2023. 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 Continue HEP as Tolerated .11/05/2023 3. Injury of right shoulder and right  upper arm. Continue HEP as Tolerated. Continue to Monitor. 11/05/2023. 4. Chronic Pain Syndrome:Continue: Refilled: Oxycodone  15 mg one tablet 4 times a day as needed for pain #120 Second script sent for the following month. We will continue the opioid monitoring program, this consists of regular clinic visits, examinations, urine drug screen, pill counts as well as use of [place]  Controlled Substance Reporting system. A 12 month History has been reviewed on the [place]  Controlled Substance Reporting System on 11/05/2023. 5. Left Knee Pain: No Complaints Today. Continue HEP as Tolerated. Continue to Monitor. 11/05/2023 6. Bilateral Hand with Neuropathic Pain: Continue Lamictal   He had NCS with Dr Sharl Davies in 12/22 Continue to Monitor. 11/05/2022   F/U in 2 months.

## 2023-11-05 ENCOUNTER — Encounter: Attending: Registered Nurse | Admitting: Registered Nurse

## 2023-11-05 ENCOUNTER — Encounter: Payer: Self-pay | Admitting: Registered Nurse

## 2023-11-05 VITALS — BP 154/82 | HR 75 | Resp 16 | Ht 70.0 in | Wt 204.8 lb

## 2023-11-05 DIAGNOSIS — M5416 Radiculopathy, lumbar region: Secondary | ICD-10-CM | POA: Insufficient documentation

## 2023-11-05 DIAGNOSIS — Z79891 Long term (current) use of opiate analgesic: Secondary | ICD-10-CM | POA: Insufficient documentation

## 2023-11-05 DIAGNOSIS — M5412 Radiculopathy, cervical region: Secondary | ICD-10-CM | POA: Diagnosis present

## 2023-11-05 DIAGNOSIS — Z5181 Encounter for therapeutic drug level monitoring: Secondary | ICD-10-CM | POA: Diagnosis present

## 2023-11-05 DIAGNOSIS — G894 Chronic pain syndrome: Secondary | ICD-10-CM | POA: Insufficient documentation

## 2023-11-05 DIAGNOSIS — M542 Cervicalgia: Secondary | ICD-10-CM | POA: Insufficient documentation

## 2023-11-05 MED ORDER — OXYCODONE HCL 15 MG PO TABS: 15.0000 mg | ORAL_TABLET | Freq: Four times a day (QID) | ORAL | 0 refills | Status: DC | PRN

## 2024-01-04 ENCOUNTER — Other Ambulatory Visit: Payer: Self-pay

## 2024-01-04 ENCOUNTER — Emergency Department (HOSPITAL_COMMUNITY)
Admission: EM | Admit: 2024-01-04 | Discharge: 2024-01-04 | Disposition: A | Discharge status: Home / Self Care | Payer: Worker's Compensation | Attending: Emergency Medicine | Admitting: Emergency Medicine

## 2024-01-04 ENCOUNTER — Encounter: Payer: Self-pay | Admitting: Registered Nurse

## 2024-01-04 ENCOUNTER — Encounter (HOSPITAL_COMMUNITY): Payer: Self-pay | Admitting: *Deleted

## 2024-01-04 ENCOUNTER — Emergency Department (HOSPITAL_COMMUNITY): Payer: Worker's Compensation

## 2024-01-04 ENCOUNTER — Encounter: Attending: Registered Nurse | Admitting: Registered Nurse

## 2024-01-04 VITALS — BP 148/86 | HR 77 | Ht 70.0 in | Wt 201.6 lb

## 2024-01-04 DIAGNOSIS — Y990 Civilian activity done for income or pay: Secondary | ICD-10-CM | POA: Diagnosis not present

## 2024-01-04 DIAGNOSIS — S60511A Abrasion of right hand, initial encounter: Secondary | ICD-10-CM | POA: Insufficient documentation

## 2024-01-04 DIAGNOSIS — S43421A Sprain of right rotator cuff capsule, initial encounter: Secondary | ICD-10-CM | POA: Diagnosis not present

## 2024-01-04 DIAGNOSIS — Z79891 Long term (current) use of opiate analgesic: Secondary | ICD-10-CM | POA: Diagnosis not present

## 2024-01-04 DIAGNOSIS — M4722 Other spondylosis with radiculopathy, cervical region: Secondary | ICD-10-CM | POA: Insufficient documentation

## 2024-01-04 DIAGNOSIS — S20211A Contusion of right front wall of thorax, initial encounter: Secondary | ICD-10-CM | POA: Insufficient documentation

## 2024-01-04 DIAGNOSIS — Z5181 Encounter for therapeutic drug level monitoring: Secondary | ICD-10-CM | POA: Insufficient documentation

## 2024-01-04 DIAGNOSIS — W19XXXA Unspecified fall, initial encounter: Secondary | ICD-10-CM

## 2024-01-04 DIAGNOSIS — R0781 Pleurodynia: Secondary | ICD-10-CM | POA: Diagnosis not present

## 2024-01-04 DIAGNOSIS — S4991XD Unspecified injury of right shoulder and upper arm, subsequent encounter: Secondary | ICD-10-CM | POA: Insufficient documentation

## 2024-01-04 DIAGNOSIS — W010XXA Fall on same level from slipping, tripping and stumbling without subsequent striking against object, initial encounter: Secondary | ICD-10-CM | POA: Insufficient documentation

## 2024-01-04 DIAGNOSIS — S46911A Strain of unspecified muscle, fascia and tendon at shoulder and upper arm level, right arm, initial encounter: Secondary | ICD-10-CM

## 2024-01-04 DIAGNOSIS — M25521 Pain in right elbow: Secondary | ICD-10-CM | POA: Insufficient documentation

## 2024-01-04 DIAGNOSIS — M542 Cervicalgia: Secondary | ICD-10-CM | POA: Diagnosis not present

## 2024-01-04 DIAGNOSIS — G894 Chronic pain syndrome: Secondary | ICD-10-CM | POA: Diagnosis not present

## 2024-01-04 DIAGNOSIS — M5416 Radiculopathy, lumbar region: Secondary | ICD-10-CM

## 2024-01-04 DIAGNOSIS — Z7984 Long term (current) use of oral hypoglycemic drugs: Secondary | ICD-10-CM | POA: Insufficient documentation

## 2024-01-04 DIAGNOSIS — W19XXXD Unspecified fall, subsequent encounter: Secondary | ICD-10-CM | POA: Insufficient documentation

## 2024-01-04 DIAGNOSIS — M25511 Pain in right shoulder: Secondary | ICD-10-CM | POA: Insufficient documentation

## 2024-01-04 DIAGNOSIS — T07XXXA Unspecified multiple injuries, initial encounter: Secondary | ICD-10-CM

## 2024-01-04 DIAGNOSIS — S50811A Abrasion of right forearm, initial encounter: Secondary | ICD-10-CM | POA: Insufficient documentation

## 2024-01-04 DIAGNOSIS — M5412 Radiculopathy, cervical region: Secondary | ICD-10-CM

## 2024-01-04 DIAGNOSIS — S4991XA Unspecified injury of right shoulder and upper arm, initial encounter: Secondary | ICD-10-CM | POA: Diagnosis present

## 2024-01-04 MED ORDER — OXYCODONE-ACETAMINOPHEN 5-325 MG PO TABS
1.0000 | ORAL_TABLET | Freq: Once | ORAL | Status: DC
  Filled 2024-01-04: qty 1

## 2024-01-04 MED ORDER — OXYCODONE HCL 15 MG PO TABS: 15.0000 mg | ORAL_TABLET | Freq: Four times a day (QID) | ORAL | 0 refills | Status: DC | PRN

## 2024-01-04 MED ORDER — METHOCARBAMOL 750 MG PO TABS
750.0000 mg | ORAL_TABLET | Freq: Three times a day (TID) | ORAL | 0 refills | Status: AC | PRN
Start: 2024-01-04 — End: ?

## 2024-01-04 NOTE — ED Provider Notes (Signed)
  EMERGENCY DEPARTMENT AT [facility] Provider Note   CSN: [id number] Arrival date & time: 01/04/24  1106     Patient presents with: Erik Taylor is a 68 y.o. male.   Pt s/p mechanical fall a few days ago - indicates was at work, was helping to carry a large cooler, when the weight shifted causing him to lose balance and fall forward. C/o right shoulder, elbow and right rib pain post fall - constant, dull, worse w certain movements and positional changes. No radicular pain. No new numbness or weakness or loss of baseline functional ability. Denies head injury or loc. No headache. No midline neck or back pain. No abd pain or nv. No anterior or exertional chest pain. No sob. No new numbness/weakness. Abrasions to right forearm/hand, indicates last tetanus shot within past 2 years.  No anticoagulant use.   The history is provided by the patient.  Fall Pertinent negatives include no abdominal pain, no headaches and no shortness of breath.       Prior to Admission medications   Medication Sig Start Date End Date Taking? Authorizing Provider  BD PEN NEEDLE NANO 2ND GEN 32G X 4 MM MISC USE ONCE, REMOVE AND PROPERLY CONTAIN 02/22/20   [provider]  Cholecalciferol (VITAMIN D) 50 MCG (2000 UT) tablet Take 2,000 Units by mouth daily. 05/19/20   [provider]  Diclofenac Sodium 3 % GEL diclofenac 3 % topical gel    [provider]  DULoxetine HCl 30 MG CSDR     [provider]  DULoxetine HCl 60 MG CSDR     [provider]  enalapril (VASOTEC) 10 MG tablet Take 10 mg by mouth daily.    [provider]  fenofibrate (TRICOR) 145 MG tablet Take 145 mg by mouth. 09/05/20   [provider]  fluticasone (FLONASE) 50 MCG/ACT nasal spray Place 1 spray into both nostrils daily.     [provider]  glipiZIDE (GLUCOTROL) 10 MG tablet Take 10 mg by mouth daily before breakfast.    [provider]  hydrOXYzine (ATARAX) 25 MG tablet     [provider]  influenza vaccine (FLUCELVAX QUADRIVALENT) 0.5 ML injection     [provider]  lamoTRIgine  (LAMICTAL ) 25 MG tablet Take 2 tablets by mouth twice daily 08/31/23   Thomas, Eunice L, NP  liraglutide (VICTOZA) 18 MG/3ML SOPN     [provider]  meloxicam (MOBIC) 7.5 MG tablet Take 7.5 mg by mouth daily. 12/05/20   [provider]  metFORMIN (GLUCOPHAGE) 500 MG tablet Take 1,000 mg by mouth 2 (two) times daily with a meal. 500 mg 2 tablets twice a day.    [provider]  naproxen  (NAPROSYN ) 375 MG tablet Take 1 tablet (375 mg total) by mouth 2 (two) times daily. 12/24/19   Couture, Cortni S, PA-C  Omega-3 Fatty Acids (FISH OIL) 1000 MG CAPS Take 1,000 capsules by mouth 1 day or 1 dose.    [provider]  omeprazole (PRILOSEC) 20 MG capsule Take 20 mg by mouth daily.    [provider]  oxyCODONE  (ROXICODONE ) 15 MG immediate release tablet Take 1 tablet (15 mg total) by mouth 4 (four) times daily as needed for pain. 01/04/24   Debby Fidela CROME, NP  OZEMPIC, 2 MG/DOSE, 8 MG/3ML SOPN Inject 2 mg into the skin once a week. 07/16/22   [provider]  Semaglutide,0.25 or 0.5MG /DOS, (OZEMPIC, 0.25 OR  0.5 MG/DOSE,) 2 MG/1.5ML SOPN     [provider]  simvastatin (ZOCOR) 40 MG tablet     [provider]  simvastatin (ZOCOR) 80 MG tablet Take 80 mg by mouth at bedtime. 11/27/19   [provider]  vitamin B-12 (CYANOCOBALAMIN) 500 MCG tablet  04/29/20   [provider]    Allergies: Methadone and Morphine and codeine    Review of Systems  Constitutional:  Negative for chills and diaphoresis.  HENT:  Negative for sinus pain and sore throat.   Respiratory:  Negative for cough and shortness of breath.   Cardiovascular:  Negative for leg swelling.       Recent chest wall contusion, no other recent chest pain.   Gastrointestinal:  Negative for  abdominal pain, diarrhea and vomiting.  Genitourinary:  Negative for dysuria.  Musculoskeletal:  Negative for neck pain and neck stiffness.       Hx chronic back pain, no acute exacerbation.   Skin:  Negative for rash.  Neurological:  Negative for weakness, numbness and headaches.  Hematological:  Does not bruise/bleed easily.    Updated Vital Signs BP 134/87   Pulse 75   Temp 97.8 F (36.6 C)   Resp 16   Ht 1.778 m (5' 10)   Wt 87.5 kg   SpO2 96%   BMI 27.69 kg/m   Physical Exam Vitals and nursing note reviewed.  Constitutional:      Appearance: Normal appearance. He is well-developed.  HENT:     Head: Atraumatic.     Nose: Nose normal.     Mouth/Throat:     Mouth: Mucous membranes are moist.     Pharynx: Oropharynx is clear. No oropharyngeal exudate or posterior oropharyngeal erythema.   Eyes:     General: No scleral icterus.    Conjunctiva/sclera: Conjunctivae normal.     Pupils: Pupils are equal, round, and reactive to light.   Neck:     Trachea: No tracheal deviation.     Comments: Trachea midline, thyroid not grossly enlarged or tender. No neck stiffness or rigidity.  Cardiovascular:     Rate and Rhythm: Normal rate and regular rhythm.     Pulses: Normal pulses.     Heart sounds: Normal heart sounds. No murmur heard.    No friction rub. No gallop.  Pulmonary:     Effort: Pulmonary effort is normal. No accessory muscle usage or respiratory distress.     Breath sounds: Normal breath sounds.  Abdominal:     General: Bowel sounds are normal. There is no distension.     Palpations: Abdomen is soft.     Tenderness: There is no abdominal tenderness.  Genitourinary:    Comments: No cva tenderness.  Musculoskeletal:        General: No swelling.     Cervical back: Normal range of motion and neck supple. No rigidity.     Comments: Tenderness right shoulder posteriorly/laterally and tenderness right elbow, otherwise good rom extremities without pain or focal bony  tenderness. Good passive rom right shoulder and elbow without pain. CTLS spine, non tender, aligned, no step off. No significant sts to RUE, compartments of arm/forearm and are soft, not tense, radial pulse 2+. Superficial abrasion to right forearm and hand without sign of infection.    Skin:    General: Skin is warm and dry.     Findings: No rash.   Neurological:     Mental Status: He is alert.     Comments:  Alert, speech clear.   Psychiatric:        Mood and Affect: Mood normal.     (all labs ordered are listed, but only abnormal results are displayed) Labs Reviewed - No data to display  EKG: None  Radiology: DG Ribs Unilateral W/Chest Right Result Date: 01/04/2024 CLINICAL DATA:  Pain after fall. EXAM: RIGHT RIBS AND CHEST - 3+ VIEW COMPARISON:  Chest radiograph dated 12/24/2019. FINDINGS: No acute osseous abnormality is identified. No obvious displaced rib fracture. There is no evidence of pneumothorax or pleural effusion. Mild left basilar atelectasis/scarring. No focal consolidation. Heart size and mediastinal contours are within normal limits. IMPRESSION: 1. No displaced rib fracture identified. Should the patient's symptoms persist or worsen, repeat radiographs of the ribs in 10 - 14 days maybe of use to detect subtle nondisplaced rib fractures (which are commonly occult on initial imaging). 2. Mild left basilar atelectasis/scarring. Electronically Signed   By: Harrietta Sherry M.D.   On: 01/04/2024 13:16   DG Elbow Complete Right Result Date: 01/04/2024 CLINICAL DATA:  Pain after fall. EXAM: RIGHT ELBOW - COMPLETE 3+ VIEW COMPARISON:  None Available. FINDINGS: There is no evidence of acute fracture, dislocation, or sizable joint effusion. Hypertrophic osseous changes at the lateral humeral epicondyle likely reflect sequela of prior trauma. No radiopaque foreign body. IMPRESSION: No acute fracture or dislocation of the right elbow. Electronically Signed   By: Harrietta Sherry M.D.    On: 01/04/2024 13:11   DG Hand Complete Right Result Date: 01/04/2024 CLINICAL DATA:  Pain after fall. EXAM: RIGHT HAND - COMPLETE 3+ VIEW COMPARISON:  None Available. FINDINGS: There is no evidence of acute fracture or dislocation. Mild first through fifth MTP joint space narrowing. Diffuse interphalangeal joint space narrowing. No radiopaque foreign body. IMPRESSION: 1. No acute osseous abnormality. 2. Mild osteoarthritis. Electronically Signed   By: Harrietta Sherry M.D.   On: 01/04/2024 13:08   DG Shoulder Right Result Date: 01/04/2024 CLINICAL DATA:  Pain after fall. EXAM: RIGHT SHOULDER - 2+ VIEW COMPARISON:  None Available. FINDINGS: There is no evidence of acute fracture or dislocation. 1.8 cm focus of mineralization adjacent to the anterior humeral head may reflect calcific tendinitis. No significant arthropathy. Soft tissues are unremarkable. IMPRESSION: 1. No acute fracture or dislocation of the right shoulder. 2. Focus of mineralization adjacent to the anterior humeral head may reflect calcific tendinitis. Electronically Signed   By: Harrietta Sherry M.D.   On: 01/04/2024 13:05     Procedures   Medications Ordered in the ED  oxyCODONE -acetaminophen  (PERCOCET/ROXICET) 5-325 MG per tablet 1 tablet (has no administration in time range)                                    Medical Decision Making Problems Addressed: Abrasion, multiple sites: acute illness or injury Accidental fall, initial encounter: acute illness or injury with systemic symptoms that poses a threat to life or bodily functions Contusion of right chest wall, initial encounter: acute illness or injury Right elbow pain: acute illness or injury Sprain of right rotator cuff capsule, initial encounter: acute illness or injury Strain of right shoulder, initial encounter: acute illness or injury  Amount and/or Complexity of Data Reviewed Independent Historian:     Details: Family/friend, hx External Data Reviewed:  notes. Radiology: ordered and independent interpretation performed. Decision-making details documented in ED Course.  Risk Prescription drug management.   Imaging ordered.  Reviewed nursing notes and prior charts for additional history.   Percocet po.   Xrays reviewed/interpreted by me - no fx.   Rx for home (pt noted to have recent refill of his other pain medication).   Rec pcp f/u.  Return precautions provided.        Final diagnoses:  None    ED Discharge Orders     None          Bernard Drivers, MD 01/04/24 1341

## 2024-01-04 NOTE — Progress Notes (Signed)
 Subjective:    Patient ID: Erik Taylor, male    DOB: Feb 09, 1956, 68 y.o.   MRN: [id number]  HPI: Erik Taylor is a 68 y.o. male who returns for follow up appointment for chronic pain and medication refill. He states his pain is located in his neck radiating into his right shoulder, also reports right shoulder, right arm pain post fall, he refuses X-ray at this time. He states he is going to go to [facility] ED after his visit today.  Also reports lower back pain radiating into his right lower extremity. He rates his pain 9. His current exercise regime is walking and performing stretching exercises.  Erik Taylor reports he was at his church last Saturday 01/01/2024, walking with a young person carrying a cooler and lost his ooting and fell forward. He was able to pick himself up, he didn't seek medical attention.  He reports increase intensity of right shoulder pain with decreased ROM, he refuses X-ray at this time. He states he is going to ED at [facility] today.   Erik Taylor.   UDS ordered Today.    Pain Inventory Average Pain 7 Pain Right Now 9 My pain is constant, sharp, dull, stabbing, and tingling  In the last 24 hours, has pain interfered with the following? General activity 6 Relation with others 5 Enjoyment of life 6 What TIME of day is your pain at its worst? daytime Sleep (in general) Poor  Pain is worse with: walking, bending, sitting, inactivity, standing, and some activites Pain improves with: rest, heat/ice, and medication Relief from Meds: 6  Family History  Problem Relation Age of Onset   Stroke Mother    Heart disease Father    Heart disease Sister    Kidney disease Brother    Social History   Socioeconomic History   Marital status: Married    Spouse name: Not on file   Number of children: Not on file   Years of education: Not on file   Highest education level: Not on file  Occupational History   Not on file   Tobacco Use   Smoking status: Former    Current packs/day: 0.00    Types: Cigarettes    Quit date: 02/01/1994    Years since quitting: 29.9   Smokeless tobacco: Never  Vaping Use   Vaping status: Never Used  Substance and Sexual Activity   Alcohol use: Never   Drug use: Never   Sexual activity: Not on file  Other Topics Concern   Not on file  Social History Narrative   Not on file   Social Drivers of Health   Financial Resource Strain: Not on file  Food Insecurity: Not on file  Transportation Needs: Not on file  Physical Activity: Not on file  Stress: Not on file  Social Connections: Not on file   Past Surgical History:  Procedure Laterality Date   arm nere repair     nerve repair   TONSILLECTOMY     Past Surgical History:  Procedure Laterality Date   arm nere repair     nerve repair   TONSILLECTOMY     Past Medical History:  Diagnosis Date   Arthritis    Diabetes mellitus without complication (HCC)    Neuromuscular disorder (HCC)    BP (!) 161/89 (BP Location: Left Arm, Patient Position: Sitting, Cuff Size: Large) Comment: Second BP reading  Pulse 77   Ht 5' 10 (1.778 m)  Wt 201 lb 9.6 oz (91.4 kg)   SpO2 97%   BMI 28.93 kg/m   Opioid Risk Score:   Fall Risk Score:  `1  Depression screen [facility] 2/9     01/04/2024    8:44 AM 07/16/2023    8:32 AM 05/21/2023    9:55 AM 03/29/2023    9:08 AM 11/04/2022    8:46 AM 09/04/2022    9:24 AM 07/07/2022    8:56 AM  Depression screen PHQ 2/9  Decreased Interest 0 0 0 0 0 1 0  Down, Depressed, Hopeless 0 0 0 0 0 1 0  PHQ - 2 Score 0 0 0 0 0 2 0     Review of Systems  Musculoskeletal:  Positive for back pain and joint swelling.       Lower back pain, right shoulder pain radiating to fingertips  All other systems reviewed and are negative.      Objective:   Physical Exam Vitals and nursing note reviewed.  Constitutional:      Appearance: Normal appearance.   Cardiovascular:     Rate and Rhythm: Normal  rate and regular rhythm.     Pulses: Normal pulses.     Heart sounds: Normal heart sounds.  Pulmonary:     Effort: Pulmonary effort is normal.     Breath sounds: Normal breath sounds.   Musculoskeletal:     Comments: Normal Muscle Bulk and Muscle Testing Reveals:  Upper Extremities: Right: Decreased ROM 20 Degrees  and Muscle Strength 3/5 Left Upper Extremity: Full ROM and Muscle Strength 5/5  Lumbar Paraspinal Tenderness: L-4-L-5 Lower Extremities: Full ROM and Muscle Strength 5/5 Arises from Chair slowly Narrow Based Gait      Skin:    General: Skin is warm and dry.   Neurological:     Mental Status: He is alert and oriented to person, place, and time.   Psychiatric:        Mood and Affect: Mood normal.        Behavior: Behavior normal.         Assessment & Plan:  1.Cervicalgia/  Cervical Spondylosis with Radiculopathy: Continue HEP as Tolerated. Continue Lamictal . 01/04/2024. 2. Acute Right shoulder pain / Post Fall: Refuses X-rays at this time. He reports he will be going to [facility] ED for Evaluation.  3. Fall subsequent encounter: Educated on [employer], he verbalizes understanding. Continue to Monitor.  4.Chronic Low Back Pain without Sciatica/ Lumbar Spondylosis with Radiculopathy. Continue HEP as Tolerated .01/04/2024 5. Injury of right shoulder and right  upper arm. Continue HEP as Tolerated. Continue to Monitor. 01/04/2024. 6. Chronic Pain Syndrome:Continue: Refilled: Oxycodone  15 mg one tablet 4 times a day as needed for pain #120 Second script sent for the following month. We will continue the opioid monitoring program, this consists of regular clinic visits, examinations, urine drug screen, pill counts as well as use of [place]  Controlled Substance Reporting system. A 12 month History has been reviewed on the [place]  Controlled Substance Reporting System on 01/04/2024. 7 Left Knee Pain: No Complaints Today. Continue HEP as Tolerated. Continue  to Monitor. 11/05/2023 8. Bilateral Hand with Neuropathic Pain: Continue Lamictal   He had NCS with Dr Carilyn in 12/22 Continue to Monitor. 11/05/2022   F/U in 2 months.

## 2024-01-04 NOTE — Discharge Instructions (Addendum)
 It was our pleasure to provide your ER care today - we hope that you feel better.  Overall, your xrays look good - no acute fracture is seen.   You may take your pain meds as need. You may also take robaxin  as need for muscle pain/spasm - no driving when taking.   Keep abrasions very clean/dry - see attached info.   Follow up with primary care doctor in 1-2 weeks if symptoms fail to improve/resolve. (Note than on occasion bony injuries/rib fractures can be 'occult' or not visible on initial xrays - if symptoms fail to improve/resolve, f/u with your doctor and discuss possible additional imaging).   Return to ER if worse, new/severe pain, chest pain, trouble breathing, numbness/weakness, high fevers, or other concern.

## 2024-01-04 NOTE — ED Triage Notes (Signed)
 Pt arrived via POV c/o injuries to his right elbow, right shoulder and right rib cage from a mechanical fall at work. Pt reports he was carrying a heavy cooler and fell face first onto asphalt. Pt reports decreased sensation and feeling in his right hand.

## 2024-01-06 ENCOUNTER — Ambulatory Visit: Admitting: Registered Nurse

## 2024-01-07 LAB — DRUG TOX MONITOR 1 W/CONF, ORAL FLD
Amphetamines: NEGATIVE ng/mL (ref <10)
Barbiturates: NEGATIVE ng/mL (ref <10)
Benzodiazepines: NEGATIVE ng/mL (ref <0.50)
Buprenorphine: NEGATIVE ng/mL (ref <0.10)
Cocaine: NEGATIVE ng/mL (ref <5.0)
Codeine: NEGATIVE ng/mL (ref <2.5)
Dihydrocodeine: NEGATIVE ng/mL (ref <2.5)
Fentanyl: NEGATIVE ng/mL (ref <0.10)
Heroin Metabolite: NEGATIVE ng/mL (ref <1.0)
Hydrocodone: NEGATIVE ng/mL (ref <2.5)
Hydromorphone: NEGATIVE ng/mL (ref <2.5)
MARIJUANA: NEGATIVE ng/mL (ref <2.5)
MDMA: NEGATIVE ng/mL (ref <10)
Meprobamate: NEGATIVE ng/mL (ref <2.5)
Methadone: NEGATIVE ng/mL (ref <5.0)
Morphine: NEGATIVE ng/mL (ref <2.5)
Nicotine Metabolite: NEGATIVE ng/mL (ref <5.0)
Norhydrocodone: NEGATIVE ng/mL (ref <2.5)
Noroxycodone: 21.3 ng/mL — ABNORMAL HIGH (ref <2.5)
Opiates: POSITIVE ng/mL — AB (ref <2.5)
Oxycodone: >250 ng/mL — ABNORMAL HIGH (ref <2.5)
Oxymorphone: NEGATIVE ng/mL (ref <2.5)
Phencyclidine: NEGATIVE ng/mL (ref <10)
Tapentadol: NEGATIVE ng/mL (ref <5.0)
Tramadol: NEGATIVE ng/mL (ref <5.0)
Zolpidem: NEGATIVE ng/mL (ref <5.0)

## 2024-01-07 LAB — DRUG TOX ALC METAB W/CON, ORAL FLD: Alcohol Metabolite: NEGATIVE ng/mL (ref <25)

## 2024-03-02 ENCOUNTER — Other Ambulatory Visit: Payer: Self-pay | Admitting: Registered Nurse

## 2024-03-02 DIAGNOSIS — M4722 Other spondylosis with radiculopathy, cervical region: Secondary | ICD-10-CM

## 2024-03-02 DIAGNOSIS — G5602 Carpal tunnel syndrome, left upper limb: Secondary | ICD-10-CM

## 2024-03-03 ENCOUNTER — Encounter: Attending: Registered Nurse | Admitting: Registered Nurse

## 2024-03-03 ENCOUNTER — Encounter: Payer: Self-pay | Admitting: Registered Nurse

## 2024-03-03 VITALS — BP 136/74 | HR 76 | Ht 70.0 in | Wt 193.0 lb

## 2024-03-03 DIAGNOSIS — M542 Cervicalgia: Secondary | ICD-10-CM | POA: Diagnosis present

## 2024-03-03 DIAGNOSIS — M5416 Radiculopathy, lumbar region: Secondary | ICD-10-CM | POA: Insufficient documentation

## 2024-03-03 DIAGNOSIS — G5602 Carpal tunnel syndrome, left upper limb: Secondary | ICD-10-CM | POA: Diagnosis present

## 2024-03-03 DIAGNOSIS — M25551 Pain in right hip: Secondary | ICD-10-CM | POA: Insufficient documentation

## 2024-03-03 DIAGNOSIS — Z79891 Long term (current) use of opiate analgesic: Secondary | ICD-10-CM | POA: Diagnosis present

## 2024-03-03 DIAGNOSIS — M545 Low back pain, unspecified: Secondary | ICD-10-CM | POA: Diagnosis present

## 2024-03-03 DIAGNOSIS — G8929 Other chronic pain: Secondary | ICD-10-CM | POA: Diagnosis present

## 2024-03-03 DIAGNOSIS — Z5181 Encounter for therapeutic drug level monitoring: Secondary | ICD-10-CM | POA: Diagnosis present

## 2024-03-03 DIAGNOSIS — M4722 Other spondylosis with radiculopathy, cervical region: Secondary | ICD-10-CM | POA: Insufficient documentation

## 2024-03-03 DIAGNOSIS — G894 Chronic pain syndrome: Secondary | ICD-10-CM | POA: Insufficient documentation

## 2024-03-03 DIAGNOSIS — M5412 Radiculopathy, cervical region: Secondary | ICD-10-CM | POA: Insufficient documentation

## 2024-03-03 MED ORDER — OXYCODONE HCL 15 MG PO TABS: 15.0000 mg | ORAL_TABLET | Freq: Four times a day (QID) | ORAL | 0 refills | Status: DC | PRN

## 2024-03-03 MED ORDER — LAMOTRIGINE 25 MG PO TABS: 50.0000 mg | ORAL_TABLET | Freq: Two times a day (BID) | ORAL | 5 refills | Status: DC

## 2024-03-03 NOTE — Progress Notes (Signed)
 Subjective:    Patient ID: Erik Taylor, male    DOB: March 03, 1956, 68 y.o.   MRN: [id number]  HPI: Erik Taylor is a 68 y.o. male who returns for follow up appointment for chronic pain and medication refill. He states his pain is located in his neck radiating into his right shoulder, right arm and right hand with tingling and burning. He also reports lower back pain radiating into his right hip. He reports increase intensity and frequency of lower back pain and right hip pain, X-rays ordered, he verba;izes understanding. He rates his pain 9. His current exercise regime is walking and performing stretching exercises.  Erik Taylor Morphine equivalent is 90.00 MME.   Last Oral Swab was Performed on 01/04/2024, it was consistent.     Pain Inventory Average Pain 8 Pain Right Now 9 My pain is sharp, burning, dull, stabbing, tingling, and aching  In the last 24 hours, has pain interfered with the following? General activity 7 Relation with others 5 Enjoyment of life 5 What TIME of day is your pain at its worst? daytime and evening Sleep (in general) Poor  Pain is worse with: walking, bending, sitting, inactivity, standing, and some activites Pain improves with: rest, heat/ice, and medication Relief from Meds: 5  Family History  Problem Relation Age of Onset   Stroke Mother    Heart disease Father    Heart disease Sister    Kidney disease Brother    Social History   Socioeconomic History   Marital status: Married    Spouse name: Not on file   Number of children: Not on file   Years of education: Not on file   Highest education level: Not on file  Occupational History   Not on file  Tobacco Use   Smoking status: Former    Current packs/day: 0.00    Types: Cigarettes    Quit date: 02/01/1994    Years since quitting: 30.1   Smokeless tobacco: Never  Vaping Use   Vaping status: Never Used  Substance and Sexual Activity   Alcohol use: Never   Drug use: Never   Sexual  activity: Not on file  Other Topics Concern   Not on file  Social History Narrative   Not on file   Social Drivers of Health   Financial Resource Strain: Not on file  Food Insecurity: Not on file  Transportation Needs: Not on file  Physical Activity: Not on file  Stress: Not on file  Social Connections: Not on file   Past Surgical History:  Procedure Laterality Date   arm nere repair     nerve repair   TONSILLECTOMY     Past Surgical History:  Procedure Laterality Date   arm nere repair     nerve repair   TONSILLECTOMY     Past Medical History:  Diagnosis Date   Arthritis    Diabetes mellitus without complication (HCC)    Neuromuscular disorder (HCC)    BP 136/74   Pulse 76   Ht 5' 10 (1.778 m)   Wt 193 lb (87.5 kg)   SpO2 99%   BMI 27.69 kg/m   Opioid Risk Score:   Fall Risk Score:  `1  Depression screen [facility] 2/9     01/04/2024    8:44 AM 07/16/2023    8:32 AM 05/21/2023    9:55 AM 03/29/2023    9:08 AM 11/04/2022    8:46 AM 09/04/2022    9:24 AM 07/07/2022  8:56 AM  Depression screen PHQ 2/9  Decreased Interest 0 0 0 0 0 1 0  Down, Depressed, Hopeless 0 0 0 0 0 1 0  PHQ - 2 Score 0 0 0 0 0 2 0     Review of Systems  Musculoskeletal:  Positive for back pain.       B/L arm pain  All other systems reviewed and are negative.      Objective:   Physical Exam Vitals and nursing note reviewed.  Constitutional:      Appearance: Normal appearance.  Neck:     Comments: Cervical Paraspinal Tenderness: C-5-C-6 Cardiovascular:     Rate and Rhythm: Normal rate and regular rhythm.  Pulmonary:     Effort: Pulmonary effort is normal.     Breath sounds: Normal breath sounds.  Musculoskeletal:     Comments: Normal Muscle Bulk and Muscle Testing Reveals:  Upper Extremities: Right: Decreased ROM 45 Degrees  and Muscle Strength 4/5 Right AC Joint Tenderness Left Upper Extremity: Full ROM and Muscle Strength  5/5 Thoracic Paraspinal Tenderness: T-1-T-7 Mainly  Right Side  Lumbar Paraspinal Tenderness: L-3-L-5 Lower Extremities: full ROM and Muscle Strength 5/5 Arises from Chair slowly Narrow Based  Gait     Skin:    General: Skin is warm and dry.  Neurological:     Mental Status: He is alert and oriented to person, place, and time.  Psychiatric:        Mood and Affect: Mood normal.        Behavior: Behavior normal.           Assessment & Plan:  1.Acute Exacerbation of Chronic Low Back Pain: RXL Lumbar X-ray. Continue to Monitor 2. Right Hip Pain: Denies falling: RX: Hip Xray. Continue to Monitor.  3. Cervicalgia/  Cervical Spondylosis with Radiculopathy: Continue HEP as Tolerated. Continue Lamictal . 01/04/2024. 4.Acute Right shoulder pain / Post Fall: Refuses X-rays at this time. He reports he will be going to [facility] ED for Evaluation.  5. Fall subsequent encounter: Educated on [employer], he verbalizes understanding. Continue to Monitor.  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 Continue HEP as Tolerated .03/03/2024 7. Injury of right shoulder and right  upper arm. Continue HEP as Tolerated. Continue to Monitor. 03/03/2024. 8. Chronic Pain Syndrome:Continue: Refilled: Oxycodone  15 mg one tablet 4 times a day as needed for pain #120 Second script sent for the following month. We will continue the opioid monitoring program, this consists of regular clinic visits, examinations, urine drug screen, pill counts as well as use of [place]  Controlled Substance Reporting system. A 12 month History has been reviewed on the [place]  Controlled Substance Reporting System on 03/03/2024. 9 Left Knee Pain: No Complaints Today. Continue HEP as Tolerated. Continue to Monitor. 03/03/2024 10. Bilateral Hand with Neuropathic Pain: Continue Lamictal   He had NCS with Dr Carilyn in 12/22 Continue to Monitor. 03/04/2023   F/U in 2 months.

## 2024-04-26 ENCOUNTER — Other Ambulatory Visit: Payer: Self-pay

## 2024-04-26 ENCOUNTER — Telehealth: Payer: Self-pay | Admitting: Registered Nurse

## 2024-04-26 DIAGNOSIS — M542 Cervicalgia: Secondary | ICD-10-CM

## 2024-04-26 DIAGNOSIS — G894 Chronic pain syndrome: Secondary | ICD-10-CM

## 2024-04-26 DIAGNOSIS — M545 Low back pain, unspecified: Secondary | ICD-10-CM

## 2024-04-26 MED ORDER — OXYCODONE HCL 15 MG PO TABS: 15.0000 mg | ORAL_TABLET | Freq: Four times a day (QID) | ORAL | 0 refills | Status: DC | PRN

## 2024-04-26 NOTE — Telephone Encounter (Signed)
 PMP was Reviewed,  Oxycodone  e-scribed to pharmacy. Erik Taylor was called regarding the above, he verbalizes understanding.

## 2024-05-05 ENCOUNTER — Ambulatory Visit: Admitting: Registered Nurse

## 2024-05-11 ENCOUNTER — Encounter: Payer: Self-pay | Admitting: Registered Nurse

## 2024-05-11 ENCOUNTER — Encounter: Attending: Registered Nurse | Admitting: Registered Nurse

## 2024-05-11 VITALS — BP 128/76 | HR 80 | Ht 70.0 in | Wt 197.4 lb

## 2024-05-11 DIAGNOSIS — Z5181 Encounter for therapeutic drug level monitoring: Secondary | ICD-10-CM | POA: Diagnosis present

## 2024-05-11 DIAGNOSIS — M5412 Radiculopathy, cervical region: Secondary | ICD-10-CM | POA: Insufficient documentation

## 2024-05-11 DIAGNOSIS — G8929 Other chronic pain: Secondary | ICD-10-CM | POA: Insufficient documentation

## 2024-05-11 DIAGNOSIS — Z79891 Long term (current) use of opiate analgesic: Secondary | ICD-10-CM | POA: Diagnosis not present

## 2024-05-11 DIAGNOSIS — M542 Cervicalgia: Secondary | ICD-10-CM | POA: Diagnosis present

## 2024-05-11 DIAGNOSIS — M545 Low back pain, unspecified: Secondary | ICD-10-CM | POA: Diagnosis not present

## 2024-05-11 DIAGNOSIS — G894 Chronic pain syndrome: Secondary | ICD-10-CM | POA: Insufficient documentation

## 2024-05-11 MED ORDER — OXYCODONE HCL 15 MG PO TABS
15.0000 mg | ORAL_TABLET | Freq: Four times a day (QID) | ORAL | 0 refills | Status: DC | PRN
Start: 2024-05-11 — End: 2024-05-11

## 2024-05-11 MED ORDER — OXYCODONE HCL 15 MG PO TABS: 15.0000 mg | ORAL_TABLET | Freq: Four times a day (QID) | ORAL | 0 refills | Status: DC | PRN

## 2024-05-11 NOTE — Progress Notes (Signed)
 Subjective:    Patient ID: Erik Taylor, male    DOB: 03/25/1956, 69 y.o.   MRN: [id number]  HPI: Erik Taylor is a 68 y.o. male who returns for follow up appointment for chronic pain and medication refill. He states his pain is located in his neck radiating into his right shoulder, right arm and right hand with tingling. Lower back pain and generalized joint pain. He rates his pain 8. His current exercise regime is walking and performing stretching exercises.  Erik Taylor Morphine equivalent is 90.00 MME.   Oral Swab was Performed today.    Pain Inventory Average Pain 8 Pain Right Now 8 My pain is sharp, burning, dull, stabbing, tingling, and aching  In the last 24 hours, has pain interfered with the following? General activity 8 Relation with others 4 Enjoyment of life 4 What TIME of day is your pain at its worst? daytime Sleep (in general) Poor  Pain is worse with: walking, bending, sitting, inactivity, standing, and some activites Pain improves with: rest, heat/ice, and medication Relief from Meds: 5  Family History  Problem Relation Age of Onset   Stroke Mother    Heart disease Father    Heart disease Sister    Kidney disease Brother    Social History   Socioeconomic History   Marital status: Married    Spouse name: Not on file   Number of children: Not on file   Years of education: Not on file   Highest education level: Not on file  Occupational History   Not on file  Tobacco Use   Smoking status: Former    Current packs/day: 0.00    Types: Cigarettes    Quit date: 02/01/1994    Years since quitting: 30.2   Smokeless tobacco: Never  Vaping Use   Vaping status: Never Used  Substance and Sexual Activity   Alcohol use: Never   Drug use: Never   Sexual activity: Not on file  Other Topics Concern   Not on file  Social History Narrative   Not on file   Social Drivers of Health   Financial Resource Strain: Not on file  Food Insecurity: Not on file   Transportation Needs: Not on file  Physical Activity: Not on file  Stress: Not on file  Social Connections: Not on file   Past Surgical History:  Procedure Laterality Date   arm nere repair     nerve repair   TONSILLECTOMY     Past Surgical History:  Procedure Laterality Date   arm nere repair     nerve repair   TONSILLECTOMY     Past Medical History:  Diagnosis Date   Arthritis    Diabetes mellitus without complication (HCC)    Neuromuscular disorder (HCC)    BP 128/76 (BP Location: Left Arm, Patient Position: Sitting, Cuff Size: Large)   Pulse 80   Ht 5' 10 (1.778 m)   Wt 197 lb 6.4 oz (89.5 kg)   SpO2 98%   BMI 28.32 kg/m   Opioid Risk Score:   Fall Risk Score:  `1  Depression screen [facility] 2/9     05/11/2024    9:02 AM 05/11/2024    8:58 AM 01/04/2024    8:44 AM 07/16/2023    8:32 AM 05/21/2023    9:55 AM 03/29/2023    9:08 AM 11/04/2022    8:46 AM  Depression screen PHQ 2/9  Decreased Interest 2 0 0 0 0 0 0  Down,  Depressed, Hopeless 2 0 0 0 0 0 0  PHQ - 2 Score 4 0 0 0 0 0 0  Altered sleeping 2        Tired, decreased energy 2        Change in appetite 1        Feeling bad or failure about yourself  0        Trouble concentrating 0        Moving slowly or fidgety/restless 0        Suicidal thoughts 0        PHQ-9 Score 9        Difficult doing work/chores Somewhat difficult            Review of Systems  Musculoskeletal:  Positive for arthralgias, back pain and myalgias.       Right shoulder pain radiating down arm, left wrist pain, low back pain  All other systems reviewed and are negative.      Objective:   Physical Exam Vitals and nursing note reviewed.  Constitutional:      Appearance: Normal appearance.  Neck:     Comments: Cervical Paraspinal Tenderness: C-5-C-6 Cardiovascular:     Rate and Rhythm: Normal rate and regular rhythm.     Pulses: Normal pulses.     Heart sounds: Normal heart sounds.  Pulmonary:     Effort: Pulmonary effort  is normal.     Breath sounds: Normal breath sounds.  Musculoskeletal:     Comments: Normal Muscle Bulk and Muscle Testing Reveals:  Upper Extremities: Right: Decreased ROM 45 Degrees  and Muscle Strength 5/5 Right AC Joint Tenderness Left upper Extremity: Full ROM and Muscle Strength 5/5  Lumbar Paraspinal Tenderness: L-3-L-5 Lower Extremities: Full ROM and Muscle Strength 5/5 Arises from Chair slowly Narrow Based  Gait     Skin:    General: Skin is warm and dry.  Neurological:     Mental Status: He is alert and oriented to person, place, and time.  Psychiatric:        Mood and Affect: Mood normal.        Behavior: Behavior normal.          Assessment & Plan:  1.Cervicalgia/  Cervical Spondylosis with Radiculopathy: Continue HEP as Tolerated. Continue Lamictal . 05/11/2024. 2. Chronic  Right shoulder pain: Continue HEP as Tolerated. Continue to Monitor. 05/11/2024 3. Fall subsequent encounter: No Falls since last visit. Educated on [employer], he verbalizes understanding. Continue to Monitor. 05/11/2024 4.Chronic Low Back Pain without Sciatica/ Lumbar Spondylosis with Radiculopathy. Continue HEP as Tolerated .05/11/2024 5. Injury of right shoulder and right  upper arm. Continue HEP as Tolerated. Continue to Monitor. 05/11/2024. 6. Chronic Pain Syndrome:Continue: Refilled: Oxycodone  15 mg one tablet 4 times a day as needed for pain #120 Second script sent for the following month. We will continue the opioid monitoring program, this consists of regular clinic visits, examinations, urine drug screen, pill counts as well as use of [place]  Controlled Substance Reporting system. A 12 month History has been reviewed on the [place]  Controlled Substance Reporting System on 05/11/2024. 7 Left Knee Pain: No Complaints Today. Continue HEP as Tolerated. Continue to Monitor. 05/11/2024 8. Bilateral Hand with Neuropathic Pain: Continue Lamictal   He had NCS with Dr Carilyn in  12/22 Continue to Monitor. 05/11/2024   F/U in 2 months.

## 2024-05-12 ENCOUNTER — Ambulatory Visit: Admitting: Registered Nurse

## 2024-05-15 LAB — DRUG TOX MONITOR 1 W/CONF, ORAL FLD
Amphetamines: NEGATIVE ng/mL (ref <10)
Barbiturates: NEGATIVE ng/mL (ref <10)
Benzodiazepines: NEGATIVE ng/mL (ref <0.50)
Buprenorphine: NEGATIVE ng/mL (ref <0.10)
Cocaine: NEGATIVE ng/mL (ref <5.0)
Codeine: NEGATIVE ng/mL (ref <2.5)
Dihydrocodeine: NEGATIVE ng/mL (ref <2.5)
Fentanyl: NEGATIVE ng/mL (ref <0.10)
Heroin Metabolite: NEGATIVE ng/mL (ref <1.0)
Hydrocodone: NEGATIVE ng/mL (ref <2.5)
Hydromorphone: NEGATIVE ng/mL (ref <2.5)
MARIJUANA: NEGATIVE ng/mL (ref <2.5)
MDMA: NEGATIVE ng/mL (ref <10)
Meprobamate: NEGATIVE ng/mL (ref <2.5)
Methadone: NEGATIVE ng/mL (ref <5.0)
Morphine: NEGATIVE ng/mL (ref <2.5)
Nicotine Metabolite: NEGATIVE ng/mL (ref <5.0)
Norhydrocodone: NEGATIVE ng/mL (ref <2.5)
Noroxycodone: 6.3 ng/mL — ABNORMAL HIGH (ref <2.5)
Opiates: POSITIVE ng/mL — AB (ref <2.5)
Oxycodone: 33.8 ng/mL — ABNORMAL HIGH (ref <2.5)
Oxymorphone: NEGATIVE ng/mL (ref <2.5)
Phencyclidine: NEGATIVE ng/mL (ref <10)
Tapentadol: NEGATIVE ng/mL (ref <5.0)
Tramadol: NEGATIVE ng/mL (ref <5.0)
Zolpidem: NEGATIVE ng/mL (ref <5.0)

## 2024-05-15 LAB — DRUG TOX ALC METAB W/CON, ORAL FLD: Alcohol Metabolite: NEGATIVE ng/mL (ref <25)

## 2024-07-21 ENCOUNTER — Encounter: Attending: Registered Nurse | Admitting: Registered Nurse

## 2024-07-21 ENCOUNTER — Encounter: Payer: Self-pay | Admitting: Registered Nurse

## 2024-07-21 VITALS — BP 127/75 | HR 84 | Ht 70.0 in | Wt 197.6 lb

## 2024-07-21 DIAGNOSIS — G894 Chronic pain syndrome: Secondary | ICD-10-CM | POA: Insufficient documentation

## 2024-07-21 DIAGNOSIS — G5602 Carpal tunnel syndrome, left upper limb: Secondary | ICD-10-CM | POA: Insufficient documentation

## 2024-07-21 DIAGNOSIS — Z5181 Encounter for therapeutic drug level monitoring: Secondary | ICD-10-CM | POA: Diagnosis not present

## 2024-07-21 DIAGNOSIS — Z79891 Long term (current) use of opiate analgesic: Secondary | ICD-10-CM | POA: Diagnosis not present

## 2024-07-21 DIAGNOSIS — M542 Cervicalgia: Secondary | ICD-10-CM | POA: Insufficient documentation

## 2024-07-21 DIAGNOSIS — M5412 Radiculopathy, cervical region: Secondary | ICD-10-CM | POA: Diagnosis not present

## 2024-07-21 DIAGNOSIS — M5416 Radiculopathy, lumbar region: Secondary | ICD-10-CM | POA: Insufficient documentation

## 2024-07-21 DIAGNOSIS — M4722 Other spondylosis with radiculopathy, cervical region: Secondary | ICD-10-CM | POA: Diagnosis present

## 2024-07-21 MED ORDER — OXYCODONE HCL 15 MG PO TABS: 15.0000 mg | ORAL_TABLET | Freq: Four times a day (QID) | ORAL | 0 refills | Status: DC | PRN

## 2024-07-21 MED ORDER — LAMOTRIGINE 25 MG PO TABS: 50.0000 mg | ORAL_TABLET | Freq: Two times a day (BID) | ORAL | 5 refills | Status: AC

## 2024-07-21 MED ORDER — OXYCODONE HCL 15 MG PO TABS: 15.0000 mg | ORAL_TABLET | Freq: Four times a day (QID) | ORAL | 0 refills | Status: AC | PRN

## 2024-07-21 NOTE — Progress Notes (Signed)
 "  Subjective:    Patient ID: Erik Taylor, male    DOB: [birth year], 69 y.o.   MRN: [id number]  HPI: Erik Taylor is a 69 y.o. male who returns for follow up appointment for chronic pain and medication refill. He states his pain is located in his neck radiating into his right shoulder, and lower back pain radiating into his right lower extremity. He rates his pain 8. His current exercise regime is walking and performing stretching exercises.  Erik Taylor Morphine equivalent is 90.00 MME.   Last Oral Swab was Performed on 05/11/2024, it was consistent.      Pain Inventory Average Pain 8 Pain Right Now 8 My pain is sharp, burning, dull, stabbing, tingling, and aching  In the last 24 hours, has pain interfered with the following? General activity 9 Relation with others 4 Enjoyment of life 4 What TIME of day is your pain at its worst? daytime Sleep (in general) Poor  Pain is worse with: walking, bending, sitting, inactivity, standing, and some activites Pain improves with: rest, heat/ice, and medication Relief from Meds: 5  Family History  Problem Relation Age of Onset   Stroke Mother    Heart disease Father    Heart disease Sister    Kidney disease Brother    Social History   Socioeconomic History   Marital status: Married    Spouse name: Not on file   Number of children: Not on file   Years of education: Not on file   Highest education level: Not on file  Occupational History   Not on file  Tobacco Use   Smoking status: Former    Current packs/day: 0.00    Types: Cigarettes    Quit date: 02/01/1994    Years since quitting: 30.4   Smokeless tobacco: Never  Vaping Use   Vaping status: Never Used  Substance and Sexual Activity   Alcohol use: Never   Drug use: Never   Sexual activity: Not on file  Other Topics Concern   Not on file  Social History Narrative   Not on file   Social Drivers of Health   Tobacco Use: Medium Risk (07/21/2024)   Patient History     Smoking Tobacco Use: Former    Smokeless Tobacco Use: Never    Passive Exposure: Not on [employer] Strain: Not on file  Food Insecurity: Not on file  Transportation Needs: Not on file  Physical Activity: Not on file  Stress: Not on file  Social Connections: Not on file  Depression (PHQ2-9): Medium Risk (05/11/2024)   Depression (PHQ2-9)    PHQ-2 Score: 9  Alcohol Screen: Not on file  Housing: Not on file  Utilities: Not on file  Health Literacy: Not on file   Past Surgical History:  Procedure Laterality Date   arm nere repair     nerve repair   TONSILLECTOMY     Past Surgical History:  Procedure Laterality Date   arm nere repair     nerve repair   TONSILLECTOMY     Past Medical History:  Diagnosis Date   Arthritis    Diabetes mellitus without complication (HCC)    Neuromuscular disorder (HCC)    BP 127/75 (BP Location: Left Arm, Patient Position: Sitting, Cuff Size: Large)   Pulse 84   Ht 5' 10 (1.778 m)   Wt 197 lb 9.6 oz (89.6 kg)   SpO2 98%   BMI 28.35 kg/m   Opioid Risk Score:  Fall Risk Score:  `1  Depression screen [facility] 2/9     05/11/2024    9:02 AM 05/11/2024    8:58 AM 01/04/2024    8:44 AM 07/16/2023    8:32 AM 05/21/2023    9:55 AM 03/29/2023    9:08 AM 11/04/2022    8:46 AM  Depression screen PHQ 2/9  Decreased Interest 2 0 0 0 0 0 0  Down, Depressed, Hopeless 2 0 0 0 0 0 0  PHQ - 2 Score 4 0 0 0 0 0 0  Altered sleeping 2        Tired, decreased energy 2        Change in appetite 1        Feeling bad or failure about yourself  0        Trouble concentrating 0        Moving slowly or fidgety/restless 0        Suicidal thoughts 0        PHQ-9 Score 9         Difficult doing work/chores Somewhat difficult           Data saved with a previous flowsheet row definition    Review of Systems  Musculoskeletal:  Positive for back pain, myalgias and neck pain.       Upper back pain, right shoulder pain radiating, right hip pain left  arm and hand pain  All other systems reviewed and are negative.      Objective:   Physical Exam Vitals and nursing note reviewed.  Constitutional:      Appearance: Normal appearance.  Neck:     Comments: Cervical Paraspinal Tenderness: C-5-C-6  Cardiovascular:     Rate and Rhythm: Normal rate and regular rhythm.     Pulses: Normal pulses.     Heart sounds: Normal heart sounds.  Pulmonary:     Effort: Pulmonary effort is normal.     Breath sounds: Normal breath sounds.  Musculoskeletal:     Comments: Normal Muscle Bulk and Muscle Testing Reveals:  Upper Extremities: Right: Decreased ROM: 45 Degrees and Muscle Strength 4/5 Left Upper Extremity: Full ROM and Muscle Strength 5/5 Bilateral AC Joint Tenderness: R>L  Lumbar Paraspinal Tenderness: L-4-L-5 Lower Extremities: Full ROM and Muscle Strength 5/5 Arises from Chair with ease Narrow Based  Gait     Skin:    General: Skin is warm and dry.  Neurological:     Mental Status: He is alert and oriented to person, place, and time.  Psychiatric:        Mood and Affect: Mood normal.        Behavior: Behavior normal.          Assessment & Plan:  1.Cervicalgia/  Cervical Spondylosis with Radiculopathy: Continue HEP as Tolerated. Continue Lamictal . 07/21/2024. 2. Chronic  Right shoulder pain: Continue HEP as Tolerated. Continue to Monitor. 07/21/2024 3. Fall subsequent encounter: No Falls since last visit. Educated on [employer], he verbalizes understanding. Continue to Monitor. 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 Continue HEP as Tolerated .07/21/2024 5. Injury of right shoulder and right  upper arm. Continue HEP as Tolerated. Continue to Monitor. 07/21/2024. 6. Chronic Pain Syndrome:Continue: Refilled: Oxycodone  15 mg one tablet 4 times a day as needed for pain #120 Second script sent for the following month. We will continue the opioid monitoring program, this  consists of regular clinic visits, examinations, urine drug screen, pill counts as well  as use of [place]  Controlled Substance Reporting system. A 12 month History has been reviewed on the [place]  Controlled Substance Reporting System on 07/21/2024. 7 Left Knee Pain: No Complaints Today. Continue HEP as Tolerated. Continue to Monitor. 07/21/2024 8. Bilateral Hand with Neuropathic Pain: Continue Lamictal   He had NCS with Dr Carilyn in 12/22 Continue to Monitor. 07/21/2024   F/U in 2 months.            "

## 2024-09-22 ENCOUNTER — Encounter: Attending: Registered Nurse | Admitting: Registered Nurse
